# Patient Record
Sex: Female | Born: 1965 | Race: Black or African American | Hispanic: No | Marital: Single | Smoking: Never smoker
Health system: Southern US, Community
[De-identification: ages and names within clinical notes are randomized; demographics above are authoritative.]

## PROBLEM LIST (undated history)

## (undated) DIAGNOSIS — M810 Age-related osteoporosis without current pathological fracture: Secondary | ICD-10-CM

## (undated) DIAGNOSIS — D219 Benign neoplasm of connective and other soft tissue, unspecified: Secondary | ICD-10-CM

## (undated) DIAGNOSIS — E785 Hyperlipidemia, unspecified: Secondary | ICD-10-CM

## (undated) DIAGNOSIS — N83209 Unspecified ovarian cyst, unspecified side: Secondary | ICD-10-CM

## (undated) DIAGNOSIS — R87619 Unspecified abnormal cytological findings in specimens from cervix uteri: Secondary | ICD-10-CM

## (undated) DIAGNOSIS — A64 Unspecified sexually transmitted disease: Secondary | ICD-10-CM

## (undated) DIAGNOSIS — G709 Myoneural disorder, unspecified: Secondary | ICD-10-CM

## (undated) HISTORY — DX: Hyperlipidemia, unspecified: E78.5

## (undated) HISTORY — PX: BREAST BIOPSY: SHX20

## (undated) HISTORY — PX: IUD REMOVAL: SHX5392

## (undated) HISTORY — DX: Unspecified ovarian cyst, unspecified side: N83.209

## (undated) HISTORY — DX: Age-related osteoporosis without current pathological fracture: M81.0

## (undated) HISTORY — PX: COLPOSCOPY: SHX161

## (undated) HISTORY — DX: Benign neoplasm of connective and other soft tissue, unspecified: D21.9

## (undated) HISTORY — PX: TONSILLECTOMY: SUR1361

## (undated) HISTORY — DX: Unspecified sexually transmitted disease: A64

## (undated) HISTORY — DX: Myoneural disorder, unspecified: G70.9

## (undated) HISTORY — PX: EXCISION VAGINAL CYST: SHX5825

## (undated) HISTORY — DX: Unspecified abnormal cytological findings in specimens from cervix uteri: R87.619

---

## 2017-03-19 ENCOUNTER — Encounter (HOSPITAL_BASED_OUTPATIENT_CLINIC_OR_DEPARTMENT_OTHER): Payer: Self-pay | Admitting: Emergency Medicine

## 2017-03-19 ENCOUNTER — Emergency Department (HOSPITAL_BASED_OUTPATIENT_CLINIC_OR_DEPARTMENT_OTHER): Payer: Self-pay

## 2017-03-19 ENCOUNTER — Emergency Department (HOSPITAL_BASED_OUTPATIENT_CLINIC_OR_DEPARTMENT_OTHER)
Admission: EM | Admit: 2017-03-19 | Discharge: 2017-03-19 | Disposition: A | Discharge status: Home / Self Care | Payer: Self-pay | Attending: Emergency Medicine | Admitting: Emergency Medicine

## 2017-03-19 DIAGNOSIS — S29012A Strain of muscle and tendon of back wall of thorax, initial encounter: Secondary | ICD-10-CM | POA: Insufficient documentation

## 2017-03-19 DIAGNOSIS — R0602 Shortness of breath: Secondary | ICD-10-CM | POA: Insufficient documentation

## 2017-03-19 DIAGNOSIS — Y929 Unspecified place or not applicable: Secondary | ICD-10-CM | POA: Insufficient documentation

## 2017-03-19 DIAGNOSIS — Y93E1 Activity, personal bathing and showering: Secondary | ICD-10-CM | POA: Insufficient documentation

## 2017-03-19 DIAGNOSIS — Y999 Unspecified external cause status: Secondary | ICD-10-CM | POA: Insufficient documentation

## 2017-03-19 DIAGNOSIS — X509XXA Other and unspecified overexertion or strenuous movements or postures, initial encounter: Secondary | ICD-10-CM | POA: Insufficient documentation

## 2017-03-19 DIAGNOSIS — R0789 Other chest pain: Secondary | ICD-10-CM

## 2017-03-19 DIAGNOSIS — S46812A Strain of other muscles, fascia and tendons at shoulder and upper arm level, left arm, initial encounter: Secondary | ICD-10-CM

## 2017-03-19 LAB — CBC WITH DIFFERENTIAL/PLATELET
BASOS ABS: 0 10*3/uL (ref 0.0–0.1)
BASOS PCT: 0 %
EOS PCT: 1 %
Eosinophils Absolute: 0 10*3/uL (ref 0.0–0.7)
HCT: 39.9 % (ref 36.0–46.0)
Hemoglobin: 13.6 g/dL (ref 12.0–15.0)
Lymphocytes Relative: 46 %
Lymphs Abs: 1.7 10*3/uL (ref 0.7–4.0)
MCH: 28.4 pg (ref 26.0–34.0)
MCHC: 34.1 g/dL (ref 30.0–36.0)
MCV: 83.3 fL (ref 78.0–100.0)
MONO ABS: 0.3 10*3/uL (ref 0.1–1.0)
Monocytes Relative: 8 %
NEUTROS ABS: 1.7 10*3/uL (ref 1.7–7.7)
Neutrophils Relative %: 45 %
PLATELETS: 211 10*3/uL (ref 150–400)
RBC: 4.79 MIL/uL (ref 3.87–5.11)
RDW: 14.1 % (ref 11.5–15.5)
WBC: 3.7 10*3/uL — AB (ref 4.0–10.5)

## 2017-03-19 LAB — TROPONIN I: Troponin I: <0.03 ng/mL (ref <0.03)

## 2017-03-19 LAB — BASIC METABOLIC PANEL
Anion gap: 7 (ref 5–15)
BUN: 16 mg/dL (ref 6–20)
CALCIUM: 9.7 mg/dL (ref 8.9–10.3)
CO2: 29 mmol/L (ref 22–32)
CREATININE: 0.76 mg/dL (ref 0.44–1.00)
Chloride: 103 mmol/L (ref 101–111)
Glucose, Bld: 102 mg/dL — ABNORMAL HIGH (ref 65–99)
Potassium: 4.3 mmol/L (ref 3.5–5.1)
SODIUM: 139 mmol/L (ref 135–145)

## 2017-03-19 LAB — D-DIMER, QUANTITATIVE (NOT AT ARMC)

## 2017-03-19 MED ORDER — SODIUM CHLORIDE 0.9 % IV BOLUS (SEPSIS)
1000.0000 mL | Freq: Once | INTRAVENOUS | Status: AC
Start: 2017-03-19 — End: 2017-03-19
  Administered 2017-03-19: 1000 mL via INTRAVENOUS

## 2017-03-19 MED ORDER — MORPHINE SULFATE (PF) 4 MG/ML IV SOLN: 6.0000 mg | Freq: Once | INTRAVENOUS | Status: DC

## 2017-03-19 MED ORDER — METHOCARBAMOL 1000 MG/10ML IJ SOLN
1000.0000 mg | Freq: Once | INTRAMUSCULAR | Status: AC
  Administered 2017-03-19: 1000 mg via INTRAVENOUS
  Filled 2017-03-19: qty 10

## 2017-03-19 MED ORDER — METHOCARBAMOL 500 MG PO TABS: 1000.0000 mg | ORAL_TABLET | Freq: Four times a day (QID) | ORAL | 0 refills | Status: DC | PRN

## 2017-03-19 MED ORDER — KETOROLAC TROMETHAMINE 30 MG/ML IJ SOLN
15.0000 mg | Freq: Once | INTRAMUSCULAR | Status: AC
  Administered 2017-03-19: 15 mg via INTRAVENOUS
  Filled 2017-03-19: qty 1

## 2017-03-19 MED ORDER — ASPIRIN 81 MG PO CHEW
324.0000 mg | CHEWABLE_TABLET | Freq: Once | ORAL | Status: AC
  Administered 2017-03-19: 324 mg via ORAL
  Filled 2017-03-19: qty 4

## 2017-03-19 MED ORDER — NITROGLYCERIN 0.4 MG SL SUBL
0.4000 mg | SUBLINGUAL_TABLET | SUBLINGUAL | Status: DC | PRN
  Administered 2017-03-19: 0.4 mg via SUBLINGUAL
  Filled 2017-03-19: qty 1

## 2017-03-19 MED ORDER — ASPIRIN EC 81 MG PO TBEC: 81.0000 mg | DELAYED_RELEASE_TABLET | Freq: Every day | ORAL | 0 refills | Status: DC

## 2017-03-19 MED ORDER — ACETAMINOPHEN 500 MG PO TABS
1000.0000 mg | ORAL_TABLET | Freq: Once | ORAL | Status: AC
  Administered 2017-03-19: 1000 mg via ORAL
  Filled 2017-03-19: qty 2

## 2017-03-19 NOTE — ED Notes (Signed)
ED Provider at bedside. 

## 2017-03-19 NOTE — ED Triage Notes (Signed)
Patient reports chest pain which began when she woke up this morning.  Reports this as a pressure at present which causes shortness of breath.  States that she now has left shoulder and left arm pain which began when she was in the shower.  States this feels as a numbness and tingling.

## 2017-03-19 NOTE — ED Notes (Signed)
Pt educated about not driving or performing other critical tasks (such as operating heavy machinery, caring for infant/toddler/child) due to sedative nature of medications received in ED. Also warned about risks of consuming alcohol or taking other medications with sedative properties. Pt/caregiver verbalized understanding.

## 2017-03-19 NOTE — ED Provider Notes (Addendum)
[facility] Provider Note   CSN: [id number] Arrival date & time: 03/19/17  1417     History   Chief Complaint Chief Complaint  Patient presents with  . Chest Pain    HPI Misty Massey is a 51 y.o. female.  HPI  51 year old female presents with a chief complaint of back pain and chest pain. She states that at around 7 am she felt some pain under her right breast. This was while she was in the shower. Then she noticed pain up over her left shoulder and left upper back. This has persisted since then. Since then she has also developed chest pressure that is worse with breathing. The pain in her back is worse with motion or sitting a certain way. She has not noticed exertional symptoms. When this first started in the shower she broke out into a sweat. She has some degree of shortness of breath but also pleuritic pain. No vomiting or abdominal pain. She feels like her arm has felt cold. No leg swelling or history of DVT/PE. No recent travel or surgery. She states she has had on and off leg pain for a couple months but thinks is due to work on the left side.  History reviewed. No pertinent past medical history.  There are no active problems to display for this patient.   History reviewed. No pertinent surgical history.  OB History    No data available       Home Medications    Prior to Admission medications   Medication Sig Start Date End Date Taking? Authorizing Provider  aspirin EC 81 MG tablet Take 1 tablet (81 mg total) by mouth daily. 03/19/17   Sherwood Gambler, MD  methocarbamol (ROBAXIN) 500 MG tablet Take 2 tablets (1,000 mg total) by mouth every 6 (six) hours as needed for muscle spasms. 03/19/17   Sherwood Gambler, MD    Family History History reviewed. No pertinent family history.  Social History Social History  Substance Use Topics  . Smoking status: Never Smoker  . Smokeless tobacco: Never Used  . Alcohol use No     Allergies   Patient has no  known allergies.   Review of Systems Review of Systems  Respiratory: Positive for shortness of breath.   Cardiovascular: Positive for chest pain. Negative for leg swelling.  Gastrointestinal: Negative for abdominal pain, nausea and vomiting.  Musculoskeletal: Positive for back pain.  Neurological: Positive for numbness.  All other systems reviewed and are negative.    Physical Exam Updated Vital Signs BP (!) 151/102 (BP Location: Right Arm)   Pulse 80   Temp 98.1 F (36.7 C) (Oral)   Resp 16   Ht 5\' 9"  (1.753 m)   Wt 68.9 kg (152 lb)   SpO2 100%   BMI 22.45 kg/m   Physical Exam  Constitutional: She is oriented to person, place, and time. She appears well-developed and well-nourished.  HENT:  Head: Normocephalic and atraumatic.  Right Ear: External ear normal.  Left Ear: External ear normal.  Nose: Nose normal.  Eyes: Right eye exhibits no discharge. Left eye exhibits no discharge.  Neck: Neck supple.  Cardiovascular: Normal rate, regular rhythm and normal heart sounds.   Pulses:      Radial pulses are 2+ on the right side, and 2+ on the left side.       Dorsalis pedis pulses are 2+ on the right side, and 2+ on the left side.  Pulmonary/Chest: Effort normal and breath sounds  normal. She exhibits no tenderness.  Abdominal: Soft. She exhibits no distension. There is no tenderness.  Musculoskeletal: She exhibits no edema.       Cervical back: She exhibits tenderness. She exhibits no swelling.       Back:  BUE are warm/well perfused and feel equal in warmth. Able to move both extremities but LUE is painful and hurts her back.  Neurological: She is alert and oriented to person, place, and time.  Skin: Skin is warm and dry. She is not diaphoretic.  Nursing note and vitals reviewed.    ED Treatments / Results  Labs (all labs ordered are listed, but only abnormal results are displayed) Labs Reviewed  CBC WITH DIFFERENTIAL/PLATELET - Abnormal; Notable for the following:        Result Value   WBC 3.7 (*)    All other components within normal limits  BASIC METABOLIC PANEL - Abnormal; Notable for the following:    Glucose, Bld 102 (*)    All other components within normal limits  TROPONIN I  D-DIMER, QUANTITATIVE (NOT AT [facility])  TROPONIN I    EKG  EKG Interpretation  Date/Time:  Friday March 19 2017 14:30:41 EDT Ventricular Rate:  89 PR Interval:    QRS Duration: 87 QT Interval:  520 QTC Calculation: 633 R Axis:   66 Text Interpretation:  Sinus rhythm Low voltage, extremity and precordial leads Nonspecific T abnormalities, inferior leads Prolonged QT interval Baseline wander in lead(s) II III aVF V1 V3 No old tracing to compare Confirmed by Sherwood Gambler [phone]) on 03/19/2017 2:57:56 PM       EKG Interpretation  Date/Time:  Friday March 19 2017 15:07:26 EDT Ventricular Rate:  82 PR Interval:    QRS Duration: 76 QT Interval:  357 QTC Calculation: 417 R Axis:   59 Text Interpretation:  Sinus rhythm Low voltage, precordial leads Borderline T abnormalities, inferior leads no significant change compared to earlier in the day Confirmed by Sherwood Gambler [phone]) on 03/19/2017 3:13:57 PM        Radiology Dg Chest 2 View  Result Date: 03/19/2017 CLINICAL DATA:  Chest pain. EXAM: CHEST  2 VIEW COMPARISON:  None. FINDINGS: The heart size and mediastinal contours are within normal limits. Both lungs are clear. Bilateral nipple shadows. The visualized skeletal structures are unremarkable. IMPRESSION: Normal chest x-ray. Electronically Signed   By: Titus Dubin M.D.   On: 03/19/2017 14:55    Procedures Procedures (including critical care time)  Medications Ordered in ED Medications  aspirin chewable tablet 324 mg (324 mg Oral Given 03/19/17 1517)  acetaminophen (TYLENOL) tablet 1,000 mg (1,000 mg Oral Given 03/19/17 1529)  sodium chloride 0.9 % bolus 1,000 mL (0 mLs Intravenous Stopped 03/19/17 1629)  ketorolac (TORADOL) 30 MG/ML  injection 15 mg (15 mg Intravenous Given 03/19/17 1544)  methocarbamol (ROBAXIN) injection 1,000 mg (1,000 mg Intravenous Given 03/19/17 1649)     Initial Impression / Assessment and Plan / ED Course  I have reviewed the triage vital signs and the nursing notes.  Pertinent labs & imaging results that were available during my care of the patient were reviewed by me and considered in my medical decision making (see chart for details).     Patient's HEART score is a 4. Thus I recommended admission for observation and further cardiac workup. She has thought about it and consider risks/benefits and decides to go home with outpatient follow-up. I will recommend she start on a baby aspirin. I think she has 2  separate issues. Her chest pressure resolved completely with one nitroglycerin. She has inferior T wave abnormalities but no ST depression or elevation. This is unchanged on multiple ECGs, even after pain resolved. Her back pain remained after nitro. This did get better with IV Toradol and IV Robaxin. I think this is muscular. I doubt dissection or PE. I did discuss that she may return at any time if she changes her mind and to especially return if her symptoms were to worsen or recur.  Final Clinical Impressions(s) / ED Diagnoses   Final diagnoses:  Chest pressure  Trapezius muscle strain, left, initial encounter    New Prescriptions Discharge Medication List as of 03/19/2017  6:59 PM    START taking these medications   Details  aspirin EC 81 MG tablet Take 1 tablet (81 mg total) by mouth daily., Starting Fri 03/19/2017, Print    methocarbamol (ROBAXIN) 500 MG tablet Take 2 tablets (1,000 mg total) by mouth every 6 (six) hours as needed for muscle spasms., Starting Fri 03/19/2017, Print         Sherwood Gambler, MD 03/19/17 1610    Sherwood Gambler, MD 03/19/17 2251

## 2017-03-24 ENCOUNTER — Encounter: Payer: Self-pay | Admitting: Cardiology

## 2017-03-24 ENCOUNTER — Ambulatory Visit (INDEPENDENT_AMBULATORY_CARE_PROVIDER_SITE_OTHER): Payer: Self-pay | Admitting: Cardiology

## 2017-03-24 DIAGNOSIS — R079 Chest pain, unspecified: Secondary | ICD-10-CM

## 2017-03-24 NOTE — Progress Notes (Signed)
Cardiology Office Note:    Date:  03/24/2017   ID:  Misty Massey, DOB 03-08-1966, MRN [id number]  PCP:  Patient, No Pcp Per  Cardiologist:  Jenean Lindau, MD   Referring MD: No ref. provider found    ASSESSMENT:    1. Chest pain, unspecified type    PLAN:    In order of problems listed above:  1. Primary prevention stressed to the patient. Importance of compliance with diet and medications stressed. Her chest pain symptoms are not typical for coronary artery disease. In view of this she will undergo exercise stress echo testing to reassure her. We will also check lipids. She will be seen in follow-up appointment in 6 weeks or earlier if she has any concerns. She knows to go to the nearest emergency room for any significant recurrences of symptoms.   Medication Adjustments/Labs and Tests Ordered: Current medicines are reviewed at length with the patient today.  Concerns regarding medicines are outlined above.  Orders Placed This Encounter  Procedures  . Lipid panel  . Hepatic function panel  . ECHOCARDIOGRAM STRESS TEST   No orders of the defined types were placed in this encounter.    History of Present Illness:    Misty Massey is a 51 y.o. female who is being seen today for the evaluation of chest pain at the request of the emergency room. Patient is a pleasant 51 year old female. She has no significant past medical history. She mentions to me that in the past she was told to have high cholesterol. She does not have a history of essential hypertension. She is an active lady but does not exercise on a regular basis. She mentions to me that she was having a shower and lifted her head and felt significant sharp pain in the shoulder. No orthopnea or PND. With activities of daily living she does not experience any symptoms. Her daughter accompanies her for this visit. At the time of my evaluation she is alert awake oriented and in no distress.   History reviewed. No  pertinent past medical history.  History reviewed. No pertinent surgical history.  Current Medications: Current Meds  Medication Sig  . aspirin EC 81 MG tablet Take 1 tablet (81 mg total) by mouth daily.  . methocarbamol (ROBAXIN) 500 MG tablet Take 2 tablets (1,000 mg total) by mouth every 6 (six) hours as needed for muscle spasms.     Allergies:   Patient has no known allergies.   Social History   Social History  . Marital status: Single    Spouse name: N/A  . Number of children: N/A  . Years of education: N/A   Social History Main Topics  . Smoking status: Never Smoker  . Smokeless tobacco: Never Used  . Alcohol use No  . Drug use: No  . Sexual activity: Not Asked   Other Topics Concern  . None   Social History Narrative  . None     Family History: The patient's Family history is unknown by patient.  ROS:   Please see the history of present illness.    All other systems reviewed and are negative.  EKGs/Labs/Other Studies Reviewed:    The following studies were reviewed today: I reviewed his reports from the emergency room and discussed these with the patient at length. EKG was reviewed also.   Recent Labs: 03/19/2017: BUN 16; Creatinine, Ser 0.76; Hemoglobin 13.6; Platelets 211; Potassium 4.3; Sodium 139  Recent Lipid Panel No results found for: CHOL, TRIG,  HDL, CHOLHDL, VLDL, LDLCALC, LDLDIRECT  Physical Exam:    VS:  BP 106/70   Pulse 88   Ht 5\' 9"  (1.753 m)   Wt 148 lb 6.4 oz (67.3 kg)   SpO2 100%   BMI 21.91 kg/m     Wt Readings from Last 3 Encounters:  03/24/17 148 lb 6.4 oz (67.3 kg)  03/19/17 152 lb (68.9 kg)     GEN: Patient is in no acute distress HEENT: Normal NECK: No JVD; No carotid bruits LYMPHATICS: No lymphadenopathy CARDIAC: S1 S2 regular, 2/6 systolic murmur at the apex. RESPIRATORY:  Clear to auscultation without rales, wheezing or rhonchi  ABDOMEN: Soft, non-tender, non-distended MUSCULOSKELETAL:  No edema; No deformity    SKIN: Warm and dry NEUROLOGIC:  Alert and oriented x 3 PSYCHIATRIC:  Normal affect    Signed, Jenean Lindau, MD  03/24/2017 4:46 PM    [facility]

## 2017-03-24 NOTE — Patient Instructions (Signed)
Medication Instructions:   Your physician recommends that you continue on your current medications as directed. Please refer to the Current Medication list given to you today.   Labwork:  NONE  Testing/Procedures:  Your physician has requested that you have a stress echocardiogram. For further information please visit [email or web address]. Please follow instruction sheet as given.    Follow-Up:  Your physician recommends that you schedule a follow-up appointment in: 6 weeks with Dr. Geraldo Pitter   Any Other Special Instructions Will Be Listed Below (If Applicable).     If you need a refill on your cardiac medications before your next appointment, please call your pharmacy.

## 2017-04-13 ENCOUNTER — Telehealth: Payer: Self-pay

## 2017-04-13 ENCOUNTER — Other Ambulatory Visit: Payer: Self-pay

## 2017-04-13 NOTE — Telephone Encounter (Signed)
Patient called to cancel stress echo. Per Baldo Ash, CMA she did not feel that it was needed at this time and that if she changed her mind she would call back. Misty Massey

## 2017-04-14 ENCOUNTER — Ambulatory Visit (HOSPITAL_BASED_OUTPATIENT_CLINIC_OR_DEPARTMENT_OTHER): Payer: Medicaid Other

## 2017-04-21 ENCOUNTER — Ambulatory Visit: Payer: Medicaid Other | Admitting: Cardiology

## 2017-05-04 ENCOUNTER — Ambulatory Visit: Payer: Medicaid Other | Admitting: Cardiology

## 2020-06-20 ENCOUNTER — Ambulatory Visit (INDEPENDENT_AMBULATORY_CARE_PROVIDER_SITE_OTHER): Payer: BC Managed Care – PPO

## 2020-06-20 ENCOUNTER — Encounter: Payer: Self-pay | Admitting: Family Medicine

## 2020-06-20 ENCOUNTER — Ambulatory Visit (INDEPENDENT_AMBULATORY_CARE_PROVIDER_SITE_OTHER): Payer: BC Managed Care – PPO | Admitting: Family Medicine

## 2020-06-20 ENCOUNTER — Other Ambulatory Visit: Payer: Self-pay

## 2020-06-20 VITALS — BP 134/90 | HR 84 | Temp 97.3°F | Ht 69.0 in | Wt 177.0 lb

## 2020-06-20 DIAGNOSIS — Z0001 Encounter for general adult medical examination with abnormal findings: Secondary | ICD-10-CM

## 2020-06-20 DIAGNOSIS — H9193 Unspecified hearing loss, bilateral: Secondary | ICD-10-CM

## 2020-06-20 DIAGNOSIS — Z0000 Encounter for general adult medical examination without abnormal findings: Secondary | ICD-10-CM

## 2020-06-20 DIAGNOSIS — Z114 Encounter for screening for human immunodeficiency virus [HIV]: Secondary | ICD-10-CM

## 2020-06-20 DIAGNOSIS — N83209 Unspecified ovarian cyst, unspecified side: Secondary | ICD-10-CM | POA: Diagnosis not present

## 2020-06-20 DIAGNOSIS — Z13228 Encounter for screening for other metabolic disorders: Secondary | ICD-10-CM

## 2020-06-20 DIAGNOSIS — G8929 Other chronic pain: Secondary | ICD-10-CM

## 2020-06-20 DIAGNOSIS — Z1159 Encounter for screening for other viral diseases: Secondary | ICD-10-CM

## 2020-06-20 DIAGNOSIS — Z1211 Encounter for screening for malignant neoplasm of colon: Secondary | ICD-10-CM

## 2020-06-20 DIAGNOSIS — Z1231 Encounter for screening mammogram for malignant neoplasm of breast: Secondary | ICD-10-CM | POA: Diagnosis not present

## 2020-06-20 DIAGNOSIS — Z130 Encounter for screening for diseases of the blood and blood-forming organs and certain disorders involving the immune mechanism: Secondary | ICD-10-CM

## 2020-06-20 DIAGNOSIS — M25572 Pain in left ankle and joints of left foot: Secondary | ICD-10-CM

## 2020-06-20 DIAGNOSIS — Z6826 Body mass index (BMI) 26.0-26.9, adult: Secondary | ICD-10-CM

## 2020-06-20 DIAGNOSIS — H539 Unspecified visual disturbance: Secondary | ICD-10-CM

## 2020-06-20 DIAGNOSIS — Z1321 Encounter for screening for nutritional disorder: Secondary | ICD-10-CM

## 2020-06-20 DIAGNOSIS — Z1329 Encounter for screening for other suspected endocrine disorder: Secondary | ICD-10-CM

## 2020-06-20 NOTE — Patient Instructions (Addendum)
Health Maintenance for Postmenopausal Women Menopause is a normal process in which your ability to get pregnant comes to an end. This process happens slowly over many months or years, usually between the ages of 60 and 31. Menopause is complete when you have missed your menstrual periods for 12 months. It is important to talk with your health care provider about some of the most common conditions that affect women after menopause (postmenopausal women). These include heart disease, cancer, and bone loss (osteoporosis). Adopting a healthy lifestyle and getting preventive care can help to promote your health and wellness. The actions you take can also lower your chances of developing some of these common conditions. What should I know about menopause? During menopause, you may get a number of symptoms, such as:  Hot flashes. These can be moderate or severe.  Night sweats.  Decrease in sex drive.  Mood swings.  Headaches.  Tiredness.  Irritability.  Memory problems.  Insomnia. Choosing to treat or not to treat these symptoms is a decision that you make with your health care provider. Do I need hormone replacement therapy?  Hormone replacement therapy is effective in treating symptoms that are caused by menopause, such as hot flashes and night sweats.  Hormone replacement carries certain risks, especially as you become older. If you are thinking about using estrogen or estrogen with progestin, discuss the benefits and risks with your health care provider. What is my risk for heart disease and stroke? The risk of heart disease, heart attack, and stroke increases as you age. One of the causes may be a change in the body's hormones during menopause. This can affect how your body uses dietary fats, triglycerides, and cholesterol. Heart attack and stroke are medical emergencies. There are many things that you can do to help prevent heart disease and stroke. Watch your blood  pressure  High blood pressure causes heart disease and increases the risk of stroke. This is more likely to develop in people who have high blood pressure readings, are of African descent, or are overweight.  Have your blood pressure checked: ? Every 3-5 years if you are 8-21 years of age. ? Every year if you are 78 years old or older. Eat a healthy diet   Eat a diet that includes plenty of vegetables, fruits, low-fat dairy products, and lean protein.  Do not eat a lot of foods that are high in solid fats, added sugars, or sodium. Get regular exercise Get regular exercise. This is one of the most important things you can do for your health. Most adults should:  Try to exercise for at least 150 minutes each week. The exercise should increase your heart rate and make you sweat (moderate-intensity exercise).  Try to do strengthening exercises at least twice each week. Do these in addition to the moderate-intensity exercise.  Spend less time sitting. Even light physical activity can be beneficial. Other tips  Work with your health care provider to achieve or maintain a healthy weight.  Do not use any products that contain nicotine or tobacco, such as cigarettes, e-cigarettes, and chewing tobacco. If you need help quitting, ask your health care provider.  Know your numbers. Ask your health care provider to check your cholesterol and your blood sugar (glucose). Continue to have your blood tested as directed by your health care provider. Do I need screening for cancer? Depending on your health history and family history, you may need to have cancer screening at different stages of your  life. This may include screening for:  Breast cancer.  Cervical cancer.  Lung cancer.  Colorectal cancer. What is my risk for osteoporosis? After menopause, you may be at increased risk for osteoporosis. Osteoporosis is a condition in which bone destruction happens more quickly than new bone creation.  To help prevent osteoporosis or the bone fractures that can happen because of osteoporosis, you may take the following actions:  If you are 69-62 years old, get at least 1,000 mg of calcium and at least 600 mg of vitamin D per day.  If you are older than age 61 but younger than age 50, get at least 1,200 mg of calcium and at least 600 mg of vitamin D per day.  If you are older than age 46, get at least 1,200 mg of calcium and at least 800 mg of vitamin D per day. Smoking and drinking excessive alcohol increase the risk of osteoporosis. Eat foods that are rich in calcium and vitamin D, and do weight-bearing exercises several times each week as directed by your health care provider. How does menopause affect my mental health? Depression may occur at any age, but it is more common as you become older. Common symptoms of depression include:  Low or sad mood.  Changes in sleep patterns.  Changes in appetite or eating patterns.  Feeling an overall lack of motivation or enjoyment of activities that you previously enjoyed.  Frequent crying spells. Talk with your health care provider if you think that you are experiencing depression. General instructions See your health care provider for regular wellness exams and vaccines. This may include:  Scheduling regular health, dental, and eye exams.  Getting and maintaining your vaccines. These include: ? Influenza vaccine. Get this vaccine each year before the flu season begins. ? Pneumonia vaccine. ? Shingles vaccine. ? Tetanus, diphtheria, and pertussis (Tdap) booster vaccine. Your health care provider may also recommend other immunizations. Tell your health care provider if you have ever been abused or do not feel safe at home. Summary  Menopause is a normal process in which your ability to get pregnant comes to an end.  This condition causes hot flashes, night sweats, decreased interest in sex, mood swings, headaches, or lack of  sleep.  Treatment for this condition may include hormone replacement therapy.  Take actions to keep yourself healthy, including exercising regularly, eating a healthy diet, watching your weight, and checking your blood pressure and blood sugar levels.  Get screened for cancer and depression. Make sure that you are up to date with all your vaccines. This information is not intended to replace advice given to you by your health care provider. Make sure you discuss any questions you have with your health care provider. Document Revised: 06/15/2018 Document Reviewed: 06/15/2018 Elsevier Patient Education  [employer].   If you have lab work done today you will be contacted with your lab results within the next 2 weeks.  If you have not heard from [place] then please contact us. The fastest way to get your results is to register for My Chart.   IF you received an x-ray today, you will receive an invoice from [facility]. Please contact [facility] at [phone] with questions or concerns regarding your invoice.   IF you received labwork today, you will receive an invoice from [facility]. Please contact [facility] at [phone] with questions or concerns regarding your invoice.   Our billing staff will not be able to assist you with questions regarding bills  from these companies.  You will be contacted with the lab results as soon as they are available. The fastest way to get your results is to activate your My Chart account. Instructions are located on the last page of this paperwork. If you have not heard from [place] regarding the results in 2 weeks, please contact this office.

## 2020-06-20 NOTE — Progress Notes (Addendum)
12/16/20219:24 AM  Misty Massey 02/05/1966, 54 y.o., female [id number]  Chief Complaint  Patient presents with  . Annual Exam    Lower Left side back pain - pt request referral to gyn due to hx of ovarian cyst     HPI:   Patient is a 54 y.o. female with past medical history significant for back pain who presents today for annual physical  Works at home. Lives by herself. Daughter visits frequently.  Screening Mammogram: Order placed Colon Ca:Order placed colonoscopy hemorrhoids PaP: will schedule here Flu: declines Covid: declines LMP 2017 Eye doctor: needs to go, poor vision Dentist: scheduled today  Left ankle pain   Depression screen PHQ 2/9 06/20/2020  Decreased Interest 0  Down, Depressed, Hopeless 0  PHQ - 2 Score 0    Fall Risk  06/20/2020  Falls in the past year? 0  Number falls in past yr: 0  Injury with Fall? 0  Follow up Falls evaluation completed     No Known Allergies  Prior to Admission medications   Medication Sig Start Date End Date Taking? Authorizing Provider  aspirin EC 81 MG tablet Take 1 tablet (81 mg total) by mouth daily. Patient not taking: Reported on 06/20/2020 03/19/17   Sherwood Gambler, MD  methocarbamol (ROBAXIN) 500 MG tablet Take 2 tablets (1,000 mg total) by mouth every 6 (six) hours as needed for muscle spasms. Patient not taking: Reported on 06/20/2020 03/19/17   Sherwood Gambler, MD    History reviewed. No pertinent past medical history.  History reviewed. No pertinent surgical history.  Social History   Tobacco Use  . Smoking status: Never Smoker  . Smokeless tobacco: Never Used  Substance Use Topics  . Alcohol use: No    Family History  Family history unknown: Yes    Review of Systems  Constitutional: Negative for chills, fever and malaise/fatigue.  HENT: Positive for hearing loss. Negative for tinnitus.   Eyes: Positive for blurred vision. Negative for double vision.       Poor vision at baseline   Respiratory: Negative for cough and shortness of breath.   Cardiovascular: Negative for chest pain, palpitations and leg swelling.  Gastrointestinal: Negative for abdominal pain, blood in stool, constipation, diarrhea, heartburn, nausea and vomiting.       Hemorrhoids  Genitourinary: Negative for dysuria, frequency and hematuria.  Musculoskeletal: Positive for back pain and joint pain (Left ankle pain).  Skin: Negative for rash.  Neurological: Positive for headaches (Takes aleve, weekly for this). Negative for dizziness and weakness.  Psychiatric/Behavioral: Negative for depression and suicidal ideas. The patient is not nervous/anxious.      OBJECTIVE:  Today's Vitals   06/20/20 0815  BP: 134/90  Pulse: 84  Temp: (!) 97.3 F (36.3 C)  SpO2: 98%  Weight: 177 lb (80.3 kg)  Height: 5' 9"  (1.753 m)   Body mass index is 26.14 kg/m.   Physical Exam Constitutional:      General: She is not in acute distress.    Appearance: Normal appearance. She is not ill-appearing.  HENT:     Head: Normocephalic.     Right Ear: Tympanic membrane, ear canal and external ear normal. There is no impacted cerumen.     Left Ear: Tympanic membrane, ear canal and external ear normal. There is no impacted cerumen.  Cardiovascular:     Rate and Rhythm: Normal rate and regular rhythm.     Pulses: Normal pulses.     Heart sounds: Normal heart sounds. No  murmur heard. No friction rub. No gallop.   Pulmonary:     Effort: Pulmonary effort is normal. No respiratory distress.     Breath sounds: Normal breath sounds. No stridor. No wheezing, rhonchi or rales.  Abdominal:     General: Bowel sounds are normal.     Palpations: Abdomen is soft.     Tenderness: There is no abdominal tenderness.  Musculoskeletal:     Right lower leg: No edema.     Left lower leg: No edema.  Skin:    General: Skin is warm and dry.  Neurological:     Mental Status: She is alert and oriented to person, place, and time.   Psychiatric:        Mood and Affect: Mood normal.        Behavior: Behavior normal.     No results found for this or any previous visit (from the past 24 hour(s)).  DG Ankle Complete Left  Result Date: 06/20/2020 CLINICAL DATA:  Pain EXAM: LEFT ANKLE COMPLETE - 3+ VIEW COMPARISON:  None. FINDINGS: Frontal, oblique, and lateral views were obtained. No fracture or joint effusion. No joint space narrowing or erosion. Ankle mortise appears intact. IMPRESSION: No fracture or appreciable arthropathy. Ankle mortise appears intact. Electronically Signed   By: Lowella Grip III M.D.   On: 06/20/2020 09:00     ASSESSMENT and PLAN  Problem List Items Addressed This Visit   None   Visit Diagnoses    Annual physical exam    -  Primary   Relevant Orders   CMP14+EGFR   Lipid Panel   Vitamin D, 25-hydroxy   Hemoglobin A1c   Magnesium   Phosphorus   Iron, TIBC and Ferritin Panel   CBC with Differential   TSH   Encounter for screening mammogram for malignant neoplasm of breast       Relevant Orders   MM DIGITAL SCREENING BILATERAL   Screening for HIV without presence of risk factors       Relevant Orders   HIV Antibody (routine testing w rflx)   Encounter for hepatitis C screening test for low risk patient       Relevant Orders   Hepatitis C antibody   BMI 26.0-26.9,adult       Special screening for malignant neoplasms, colon       Relevant Orders   Ambulatory referral to Gastroenterology   Chronic pain of left ankle       Relevant Orders   DG Ankle Complete Left (Completed)   Cyst of ovary, unspecified laterality       Relevant Orders   US Transvaginal Non-OB   Vision changes       Relevant Orders   Ambulatory referral to Ophthalmology   Bilateral hearing loss, unspecified hearing loss type       Relevant Orders   Ambulatory referral to Audiology     Will follow up with lab results  Encouraged her to look at vaccination records to determine last Tdap  Needs to  schedule pap.  Return in about 6 months (around 12/19/2020).    [name], FNP-BC [facility] at [place], [place] [address] Ph.  [phone] Fax [phone]  I have reviewed and agree with above documentation. Agustina Caroli, MD

## 2020-06-21 ENCOUNTER — Other Ambulatory Visit: Payer: Self-pay | Admitting: Family Medicine

## 2020-06-21 DIAGNOSIS — E559 Vitamin D deficiency, unspecified: Secondary | ICD-10-CM | POA: Insufficient documentation

## 2020-06-21 DIAGNOSIS — E782 Mixed hyperlipidemia: Secondary | ICD-10-CM | POA: Insufficient documentation

## 2020-06-21 LAB — LIPID PANEL
Chol/HDL Ratio: 4.7 ratio — ABNORMAL HIGH (ref 0.0–4.4)
Cholesterol, Total: 254 mg/dL — ABNORMAL HIGH (ref 100–199)
HDL: 54 mg/dL (ref >39)
LDL Chol Calc (NIH): 183 mg/dL — ABNORMAL HIGH (ref 0–99)
Triglycerides: 98 mg/dL (ref 0–149)
VLDL Cholesterol Cal: 17 mg/dL (ref 5–40)

## 2020-06-21 LAB — CMP14+EGFR
ALT: 39 IU/L — ABNORMAL HIGH (ref 0–32)
AST: 28 IU/L (ref 0–40)
Albumin/Globulin Ratio: 1.9 (ref 1.2–2.2)
Albumin: 4.6 g/dL (ref 3.8–4.9)
Alkaline Phosphatase: 90 IU/L (ref 44–121)
BUN/Creatinine Ratio: 19 (ref 9–23)
BUN: 16 mg/dL (ref 6–24)
Bilirubin Total: 0.5 mg/dL (ref 0.0–1.2)
CO2: 26 mmol/L (ref 20–29)
Calcium: 9.4 mg/dL (ref 8.7–10.2)
Chloride: 102 mmol/L (ref 96–106)
Creatinine, Ser: 0.85 mg/dL (ref 0.57–1.00)
GFR calc Af Amer: 90 mL/min/{1.73_m2} (ref >59)
GFR calc non Af Amer: 78 mL/min/{1.73_m2} (ref >59)
Globulin, Total: 2.4 g/dL (ref 1.5–4.5)
Glucose: 94 mg/dL (ref 65–99)
Potassium: 3.8 mmol/L (ref 3.5–5.2)
Sodium: 140 mmol/L (ref 134–144)
Total Protein: 7 g/dL (ref 6.0–8.5)

## 2020-06-21 LAB — CBC WITH DIFFERENTIAL/PLATELET
Basophils Absolute: 0 10*3/uL (ref 0.0–0.2)
Basos: 0 %
EOS (ABSOLUTE): 0.1 10*3/uL (ref 0.0–0.4)
Eos: 2 %
Hematocrit: 42.1 % (ref 34.0–46.6)
Hemoglobin: 13.8 g/dL (ref 11.1–15.9)
Immature Grans (Abs): 0 10*3/uL (ref 0.0–0.1)
Immature Granulocytes: 0 %
Lymphocytes Absolute: 1.6 10*3/uL (ref 0.7–3.1)
Lymphs: 46 %
MCH: 27.9 pg (ref 26.6–33.0)
MCHC: 32.8 g/dL (ref 31.5–35.7)
MCV: 85 fL (ref 79–97)
Monocytes Absolute: 0.3 10*3/uL (ref 0.1–0.9)
Monocytes: 8 %
Neutrophils Absolute: 1.6 10*3/uL (ref 1.4–7.0)
Neutrophils: 44 %
Platelets: 217 10*3/uL (ref 150–450)
RBC: 4.94 x10E6/uL (ref 3.77–5.28)
RDW: 14.1 % (ref 11.7–15.4)
WBC: 3.6 10*3/uL (ref 3.4–10.8)

## 2020-06-21 LAB — PHOSPHORUS: Phosphorus: 4.1 mg/dL (ref 3.0–4.3)

## 2020-06-21 LAB — VITAMIN D 25 HYDROXY (VIT D DEFICIENCY, FRACTURES): Vit D, 25-Hydroxy: 22.3 ng/mL — ABNORMAL LOW (ref 30.0–100.0)

## 2020-06-21 LAB — HEMOGLOBIN A1C
Est. average glucose Bld gHb Est-mCnc: 111 mg/dL
Hgb A1c MFr Bld: 5.5 % (ref 4.8–5.6)

## 2020-06-21 LAB — HEPATITIS C ANTIBODY: Hep C Virus Ab: <0.1 s/co ratio (ref 0.0–0.9)

## 2020-06-21 LAB — HIV ANTIBODY (ROUTINE TESTING W REFLEX): HIV Screen 4th Generation wRfx: NONREACTIVE

## 2020-06-21 LAB — IRON,TIBC AND FERRITIN PANEL
Ferritin: 28 ng/mL (ref 15–150)
Iron Saturation: 20 % (ref 15–55)
Iron: 66 ug/dL (ref 27–159)
Total Iron Binding Capacity: 324 ug/dL (ref 250–450)
UIBC: 258 ug/dL (ref 131–425)

## 2020-06-21 LAB — MAGNESIUM: Magnesium: 2.1 mg/dL (ref 1.6–2.3)

## 2020-06-21 LAB — TSH: TSH: 2.8 u[IU]/mL (ref 0.450–4.500)

## 2020-06-21 MED ORDER — VITAMIN D3 50 MCG (2000 UT) PO TABS: 1.0000 | ORAL_TABLET | Freq: Every day | ORAL | 3 refills | Status: DC

## 2020-06-21 MED ORDER — ROSUVASTATIN CALCIUM 10 MG PO TABS: 10.0000 mg | ORAL_TABLET | Freq: Every day | ORAL | 3 refills | Status: DC

## 2020-06-21 NOTE — Progress Notes (Unsigned)
c 

## 2020-06-21 NOTE — Progress Notes (Signed)
If you could let Misty Massey know overall her labs look good. Her vitamin D is low for which I would recommend a daily OTC Vitamin D3 supplement taken with food. Her Cholesterol is quite high. I would recommend starting a daily medication called crestor. I will send this to her pharmacy for her to start taking daily.

## 2020-06-24 ENCOUNTER — Telehealth: Payer: Self-pay | Admitting: Family Medicine

## 2020-06-24 NOTE — Telephone Encounter (Signed)
Conception Oms,  From [facility] imaging, calling stating that they are needing more info regarding this pts orders that were placed. They are requesting to have a call back.     [phone] ext [phone]

## 2020-06-25 NOTE — Telephone Encounter (Signed)
Called back and had to leave message 2 recent orders unsure what information is needed

## 2020-06-26 ENCOUNTER — Telehealth: Payer: Self-pay

## 2020-06-26 DIAGNOSIS — N83209 Unspecified ovarian cyst, unspecified side: Secondary | ICD-10-CM

## 2020-06-26 NOTE — Telephone Encounter (Signed)
No additional notes required  

## 2020-07-10 ENCOUNTER — Ambulatory Visit
Admission: RE | Admit: 2020-07-10 | Discharge: 2020-07-10 | Disposition: A | Discharge status: Home / Self Care | Payer: Medicaid Other | Source: Ambulatory Visit | Attending: Family Medicine | Admitting: Family Medicine

## 2020-07-10 DIAGNOSIS — N83209 Unspecified ovarian cyst, unspecified side: Secondary | ICD-10-CM

## 2020-07-16 ENCOUNTER — Ambulatory Visit: Payer: BC Managed Care – PPO | Attending: Family Medicine | Admitting: Audiologist

## 2020-07-16 ENCOUNTER — Other Ambulatory Visit: Payer: Self-pay

## 2020-07-16 DIAGNOSIS — H9193 Unspecified hearing loss, bilateral: Secondary | ICD-10-CM | POA: Diagnosis present

## 2020-07-16 NOTE — Procedures (Signed)
  Outpatient Audiology and [place], [place]  [id number] [phone]  AUDIOLOGICAL  EVALUATION  NAME: Misty Massey     DOB:   Mar 30, 1966      MRN: [id number]                                                                                     DATE: 07/16/2020     REFERENT: Just, Laurita Quint, FNP STATUS: Outpatient DIAGNOSIS: Normal Hearing    History: [name] was seen for an audiological evaluation. [name] is receiving a hearing evaluation due to concerns for hearing loss in the right ear. [name] has difficulty hearing while working from home. She wears a headset in her right ear. The people she is talking to sound muffled and unclear. No pain or pressure reported in either ear. No tinnitus present in both either ear. [name] has no history of ear pathology or infection. She fell and his her head over six months ago but no change in hearing was noted after the fall. Medical history negative for any risk factor for hearing loss. No other relevant case history reported.    Evaluation:   Otoscopy showed a clear view of the tympanic membranes, bilaterally  Tympanometry results were consistent with normal middle ear function, bilaterally    Audiometric testing was completed using conventional audiometry with insert transducer. Speech Recognition Thresholds were consistent with pure tone averages. Word Recognition was excellent at conversation level. Pure tone thresholds show normal hearing in both ears. Test results are consistent with normal hearing with good speech understanding.   Results:  The test results were reviewed with [facility]. She has normal hearing in both ears. She was able to repeat words down to a whisper level and repeat words at average conversational volume with 100% accuracy. She was told to invest in new headphones.    Recommendations: 1.   No further audiologic testing is needed unless future hearing concerns arise.    Alfonse Alpers   Audiologist, Au.D., CCC-A 07/16/2020  1:14 PM  Cc: Just, Laurita Quint, FNP

## 2020-08-21 ENCOUNTER — Other Ambulatory Visit: Payer: Self-pay

## 2020-08-21 ENCOUNTER — Ambulatory Visit
Admission: RE | Admit: 2020-08-21 | Discharge: 2020-08-21 | Disposition: A | Discharge status: Home / Self Care | Payer: BC Managed Care – PPO | Source: Ambulatory Visit | Attending: Family Medicine | Admitting: Family Medicine

## 2020-08-21 DIAGNOSIS — Z1231 Encounter for screening mammogram for malignant neoplasm of breast: Secondary | ICD-10-CM

## 2020-08-31 NOTE — Addendum Note (Signed)
Addended by: Davina Poke on: 08/31/2020 11:32 AM   Modules accepted: Orders

## 2020-09-05 ENCOUNTER — Ambulatory Visit (AMBULATORY_SURGERY_CENTER): Payer: BC Managed Care – PPO | Admitting: *Deleted

## 2020-09-05 ENCOUNTER — Other Ambulatory Visit: Payer: Self-pay

## 2020-09-05 VITALS — Ht 69.0 in | Wt 168.0 lb

## 2020-09-05 DIAGNOSIS — Z1211 Encounter for screening for malignant neoplasm of colon: Secondary | ICD-10-CM

## 2020-09-05 MED ORDER — NA SULFATE-K SULFATE-MG SULF 17.5-3.13-1.6 GM/177ML PO SOLN: ORAL | 0 refills | Status: DC

## 2020-09-05 NOTE — Progress Notes (Signed)
Patient's pre-visit was done today over the phone with the patient due to COVID-19 pandemic. Name,DOB and address verified. Insurance verified. Patient denies any allergies to Eggs and Soy. Patient denies any problems with anesthesia/sedation. Patient denies taking diet pills or blood thinners. Packet of Prep instructions mailed to patient including a copy of a consent form and pre-procedure patient acknowledgement form (with envelope to mail back to us)-pt is aware. Suprep  Prep coupon included. Patient understands to call us back with any questions or concerns. The patient is COVID-19 fully vaccinated, per patient. Patient is aware of our care-partner policy and [id number]-[id number] safety protocol. EMMI education assigned to the patient for the procedure, sent to [facility].

## 2020-09-19 ENCOUNTER — Encounter: Payer: Self-pay | Admitting: Internal Medicine

## 2020-09-19 ENCOUNTER — Other Ambulatory Visit: Payer: Self-pay

## 2020-09-19 ENCOUNTER — Telehealth: Payer: Self-pay | Admitting: Gastroenterology

## 2020-09-19 ENCOUNTER — Ambulatory Visit (AMBULATORY_SURGERY_CENTER): Payer: BC Managed Care – PPO | Admitting: Internal Medicine

## 2020-09-19 VITALS — BP 117/81 | HR 76 | Temp 97.7°F | Resp 13 | Ht 69.0 in | Wt 168.0 lb

## 2020-09-19 DIAGNOSIS — Z1211 Encounter for screening for malignant neoplasm of colon: Secondary | ICD-10-CM | POA: Diagnosis present

## 2020-09-19 MED ORDER — SODIUM CHLORIDE 0.9 % IV SOLN: 500.0000 mL | Freq: Once | INTRAVENOUS | Status: DC

## 2020-09-19 NOTE — Progress Notes (Signed)
No problems noted in the recovery room. maw 

## 2020-09-19 NOTE — Progress Notes (Signed)
Vitals-CW  Pt's states no medical or surgical changes since previsit or office visit.  Patient states that she had some blood streaked vomit.  She vomited 6 times.  Has not vomited since, and doesn't have vomited usually.

## 2020-09-19 NOTE — Telephone Encounter (Signed)
Oncall Note: Answering service sent a message that patient vomited immediately after drinking prep, callback number was inaccurate. I tried calling listed number in chart and patient didn't pick up. Please follow up this AM

## 2020-09-19 NOTE — Progress Notes (Signed)
PT taken to PACU. Monitors in place. VSS. Report given to RN. 

## 2020-09-19 NOTE — Patient Instructions (Addendum)
Normal Colonoscopy! You may resume your current medications today. Repeat colonoscopy in 10 years for screening purposes. Please call if any questions or concerns.    YOU HAD AN ENDOSCOPIC PROCEDURE TODAY AT [facility]:   Refer to the procedure report that was given to you for any specific questions about what was found during the examination.  If the procedure report does not answer your questions, please call your gastroenterologist to clarify.  If you requested that your care partner not be given the details of your procedure findings, then the procedure report has been included in a sealed envelope for you to review at your convenience later.  YOU SHOULD EXPECT: Some feelings of bloating in the abdomen. Passage of more gas than usual.  Walking can help get rid of the air that was put into your GI tract during the procedure and reduce the bloating. If you had a lower endoscopy (such as a colonoscopy or flexible sigmoidoscopy) you may notice spotting of blood in your stool or on the toilet paper. If you underwent a bowel prep for your procedure, you may not have a normal bowel movement for a few days.  Please Note:  You might notice some irritation and congestion in your nose or some drainage.  This is from the oxygen used during your procedure.  There is no need for concern and it should clear up in a day or so.  SYMPTOMS TO REPORT IMMEDIATELY:   Following lower endoscopy (colonoscopy or flexible sigmoidoscopy):  Excessive amounts of blood in the stool  Significant tenderness or worsening of abdominal pains  Swelling of the abdomen that is new, acute  Fever of 100F or higher   For urgent or emergent issues, a gastroenterologist can be reached at any hour by calling [phone]. Do not use MyChart messaging for urgent concerns.    DIET:  We do recommend a small meal at first, but then you may proceed to your regular diet.  Drink plenty of fluids but you should avoid  alcoholic beverages for 24 hours.  ACTIVITY:  You should plan to take it easy for the rest of today and you should NOT DRIVE or use heavy machinery until tomorrow (because of the sedation medicines used during the test).    FOLLOW UP: Our staff will call the number listed on your records 48-72 hours following your procedure to check on you and address any questions or concerns that you may have regarding the information given to you following your procedure. If we do not reach you, we will leave a message.  We will attempt to reach you two times.  During this call, we will ask if you have developed any symptoms of COVID 19. If you develop any symptoms (ie: fever, flu-like symptoms, shortness of breath, cough etc.) before then, please call [phone].  If you test positive for Covid 19 in the 2 weeks post procedure, please call and report this information to [place].    If any biopsies were taken you will be contacted by phone or by letter within the next 1-3 weeks.  Please call us at [phone] if you have not heard about the biopsies in 3 weeks.    SIGNATURES/CONFIDENTIALITY: You and/or your care partner have signed paperwork which will be entered into your electronic medical record.  These signatures attest to the fact that that the information above on your After Visit Summary has been reviewed and is understood.  Full responsibility of the confidentiality of  this discharge information lies with you and/or your care-partner.

## 2020-09-19 NOTE — Op Note (Signed)
[facility] Patient Name: Misty Massey Procedure Date: 09/19/2020 10:01 AM MRN: [id number] Endoscopist: Docia Chuck. Henrene Pastor , MD Age: 55 Referring MD:  Date of Birth: April 02, 1966 Gender: Female Account #: [id number] Procedure:                Colonoscopy Indications:              Screening for colorectal malignant neoplasm Medicines:                Monitored Anesthesia Care Procedure:                Pre-Anesthesia Assessment:                           - Prior to the procedure, a History and Physical                            was performed, and patient medications and                            allergies were reviewed. The patient's tolerance of                            previous anesthesia was also reviewed. The risks                            and benefits of the procedure and the sedation                            options and risks were discussed with the patient.                            All questions were answered, and informed consent                            was obtained. Prior Anticoagulants: The patient has                            taken no previous anticoagulant or antiplatelet                            agents. After reviewing the risks and benefits, the                            patient was deemed in satisfactory condition to                            undergo the procedure.                           After obtaining informed consent, the colonoscope                            was passed under direct vision. Throughout the  procedure, the patient's blood pressure, pulse, and                            oxygen saturations were monitored continuously. The                            Olympus [id number]) [id number] was introduced                            through the anus and advanced to the the cecum,                            identified by appendiceal orifice and ileocecal                            valve. The ileocecal valve,  appendiceal orifice,                            and rectum were photographed. The quality of the                            bowel preparation was excellent. The colonoscopy                            was performed without difficulty. The patient                            tolerated the procedure well. The bowel preparation                            used was SUPREP via split dose instruction. Scope In: 10:12:21 AM Scope Out: 10:25:10 AM Scope Withdrawal Time: 0 hours 9 minutes 45 seconds  Total Procedure Duration: 0 hours 12 minutes 49 seconds  Findings:                 The entire examined colon appeared normal on direct                            and retroflexion views. Complications:            No immediate complications. Estimated blood loss:                            None. Estimated Blood Loss:     Estimated blood loss: none. Impression:               - The entire examined colon is normal on direct and                            retroflexion views.                           - No specimens collected. Recommendation:           - Repeat colonoscopy in 10 years for screening  purposes.                           - Patient has a contact number available for                            emergencies. The signs and symptoms of potential                            delayed complications were discussed with the                            patient. Return to normal activities tomorrow.                            Written discharge instructions were provided to the                            patient.                           - Resume previous diet.                           - Continue present medications. Docia Chuck. Henrene Pastor, MD 09/19/2020 10:28:53 AM This report has been signed electronically.

## 2020-09-23 ENCOUNTER — Telehealth: Payer: Self-pay | Admitting: *Deleted

## 2020-09-23 NOTE — Telephone Encounter (Signed)
No answer for post procedure call back. Left message for patient to call with questions or concerns. 

## 2020-09-23 NOTE — Telephone Encounter (Signed)
Left message on f/u call 

## 2020-12-19 ENCOUNTER — Ambulatory Visit: Payer: BC Managed Care – PPO | Admitting: Family Medicine

## 2020-12-19 ENCOUNTER — Other Ambulatory Visit: Payer: Self-pay

## 2020-12-19 ENCOUNTER — Encounter: Payer: Self-pay | Admitting: Nurse Practitioner

## 2020-12-19 ENCOUNTER — Other Ambulatory Visit (HOSPITAL_COMMUNITY)
Admission: RE | Admit: 2020-12-19 | Discharge: 2020-12-19 | Disposition: A | Discharge status: Home / Self Care | Payer: BC Managed Care – PPO | Source: Ambulatory Visit | Attending: Nurse Practitioner | Admitting: Nurse Practitioner

## 2020-12-19 ENCOUNTER — Ambulatory Visit (INDEPENDENT_AMBULATORY_CARE_PROVIDER_SITE_OTHER): Payer: BC Managed Care – PPO | Admitting: Nurse Practitioner

## 2020-12-19 ENCOUNTER — Encounter: Payer: BC Managed Care – PPO | Admitting: Family Medicine

## 2020-12-19 VITALS — BP 120/78 | HR 80 | Resp 16 | Ht 69.75 in | Wt 166.0 lb

## 2020-12-19 DIAGNOSIS — R350 Frequency of micturition: Secondary | ICD-10-CM | POA: Diagnosis not present

## 2020-12-19 DIAGNOSIS — Z01419 Encounter for gynecological examination (general) (routine) without abnormal findings: Secondary | ICD-10-CM | POA: Diagnosis present

## 2020-12-19 DIAGNOSIS — A64 Unspecified sexually transmitted disease: Secondary | ICD-10-CM | POA: Insufficient documentation

## 2020-12-19 LAB — WET PREP FOR TRICH, YEAST, CLUE

## 2020-12-19 NOTE — Patient Instructions (Signed)
Happy Birthday tomorrow! Some things we discussed today: Make sure you establish care with another Primary care provider to help manage your High Cholesterol. I have included information about that in this summary. Make sure you exercise 150 min per week. Avoid processed foods. Incorporate healthy fat in your diet like salmon, avocado and olive oil.  Return to office if your urinary symptoms do not resolve. Your pap smear was collected today   Dyslipidemia Dyslipidemia is an imbalance of waxy, fat-like substances (lipids) in the blood. The body needs lipids in small amounts. Dyslipidemia often involves a high level of cholesterol or triglycerides, which are types oflipids. Common forms of dyslipidemia include: High levels of LDL cholesterol. LDL is the type of cholesterol that causes fatty deposits (plaques) to build up in the blood vessels that carry blood away from your heart (arteries). Low levels of HDL cholesterol. HDL cholesterol is the type of cholesterol that protects against heart disease. High levels of HDL remove the LDL buildup from arteries. High levels of triglycerides. Triglycerides are a fatty substance in the blood that is linked to a buildup of plaques in the arteries. What are the causes? Primary dyslipidemia is caused by changes (mutations) in genes that are passed down through families (inherited). These mutations cause several types of dyslipidemia. Secondary dyslipidemia is caused by lifestyle choices and diseases that lead to dyslipidemia, such as: Eating a diet that is high in animal fat. Not getting enough exercise. Having diabetes, kidney disease, liver disease, or thyroid disease. Drinking large amounts of alcohol. Using certain medicines. What increases the risk? You are more likely to develop this condition if you are an older man or if you are a woman who has gone through menopause. Other risk factors include: Having a family history of dyslipidemia. Taking  certain medicines, including birth control pills, steroids, some diuretics, and beta-blockers. Smoking cigarettes. Eating a high-fat diet. Having certain medical conditions such as diabetes, polycystic ovary syndrome (PCOS), kidney disease, liver disease, or hypothyroidism. Not exercising regularly. Being overweight or obese with too much belly fat. What are the signs or symptoms? In most cases, dyslipidemia does not usually cause any symptoms. In severe cases, very high lipid levels can cause: Fatty bumps under the skin (xanthomas). White or gray ring around the black center (pupil) of the eye. Very high triglyceride levels can cause inflammation of the pancreas (pancreatitis). How is this diagnosed? Your health care provider may diagnose dyslipidemia based on a routine blood test (fasting blood test). Because most people do not have symptoms of the condition, this blood testing (lipid profile) is done on adults age 23 and older and is repeated every 5 years. This test checks: Total cholesterol. This measures the total amount of cholesterol in your blood, including LDL cholesterol, HDL cholesterol, and triglycerides. A healthy number is below 200. LDL cholesterol. The target number for LDL cholesterol is different for each person, depending on individual risk factors. Ask your health care provider what your LDL cholesterol should be. HDL cholesterol. An HDL level of 60 or higher is best because it helps to protect against heart disease. A number below 77 for men or below 36 for women increases the risk for heart disease. Triglycerides. A healthy triglyceride number is below 150. If your lipid profile is abnormal, your health care provider may do other bloodtests. How is this treated? Treatment depends on the type of dyslipidemia that you have and your other risk factors for heart disease and stroke. Your health care provider  will have atarget range for your lipid levels based on this  information. For many people, this condition may be treated by lifestyle changes, such as diet and exercise. Your health care provider may recommend that you: Get regular exercise. Make changes to your diet. Quit smoking if you smoke. If diet changes and exercise do not help you reach your goals, your health care provider may also prescribe medicine to lower lipids. The most commonly prescribed type of medicine lowers your LDL cholesterol (statin drug). If you have a high triglyceride level, your provider may prescribe another type of drug (fibrate) or an omega-3 fish oil supplement, or both. Follow these instructions at home:  Eating and drinking Follow instructions from your health care provider or dietitian about eating or drinking restrictions. Eat a healthy diet as told by your health care provider. This can help you reach and maintain a healthy weight, lower your LDL cholesterol, and raise your HDL cholesterol. This may include: Limiting your calories, if you are overweight. Eating more fruits, vegetables, whole grains, fish, and lean meats. Limiting saturated fat, trans fat, and cholesterol. If you drink alcohol: Limit how much you use. Be aware of how much alcohol is in your drink. In the [place] one drink equals one 12 oz bottle of beer (355 mL), one 5 oz glass of wine (148 mL), or one 1 oz glass of hard liquor (44 mL). Do not drink alcohol if: Your health care provider tells you not to drink. You are pregnant, may be pregnant, or are planning to become pregnant. Activity Get regular exercise. Start an exercise and strength training program as told by your health care provider. Ask your health care provider what activities are safe for you. Your health care provider may recommend: 30 minutes of aerobic activity 4-6 days a week. Brisk walking is an example of aerobic activity. Strength training 2 days a week. General instructions Do not use any products that contain nicotine or  tobacco, such as cigarettes, e-cigarettes, and chewing tobacco. If you need help quitting, ask your health care provider. Take over-the-counter and prescription medicines only as told by your health care provider. This includes supplements. Keep all follow-up visits as told by your health care provider. Contact a health care provider if: You are: Having trouble sticking to your exercise or diet plan. Struggling to quit smoking or control your use of alcohol. Summary Dyslipidemia often involves a high level of cholesterol or triglycerides, which are types of lipids. Treatment depends on the type of dyslipidemia that you have and your other risk factors for heart disease and stroke. For many people, treatment starts with lifestyle changes, such as diet and exercise. Your health care provider may prescribe medicine to lower lipids. This information is not intended to replace advice given to you by your health care provider. Make sure you discuss any questions you have with your healthcare provider. Document Revised: 02/14/2018 Document Reviewed: 01/21/2018 Elsevier Patient Education  [place] Maintenance, Female Adopting a healthy lifestyle and getting preventive care are important in promoting health and wellness. Ask your health care provider about: The right schedule for you to have regular tests and exams. Things you can do on your own to prevent diseases and keep yourself healthy. What should I know about diet, weight, and exercise? Eat a healthy diet  Eat a diet that includes plenty of vegetables, fruits, low-fat dairy products, and lean protein. Do not eat a lot of foods that are high  in solid fats, added sugars, or sodium.  Maintain a healthy weight Body mass index (BMI) is used to identify weight problems. It estimates body fat based on height and weight. Your health care provider can help determineyour BMI and help you achieve or maintain a healthy  weight. Get regular exercise Get regular exercise. This is one of the most important things you can do for your health. Most adults should: Exercise for at least 150 minutes each week. The exercise should increase your heart rate and make you sweat (moderate-intensity exercise). Do strengthening exercises at least twice a week. This is in addition to the moderate-intensity exercise. Spend less time sitting. Even light physical activity can be beneficial. Watch cholesterol and blood lipids Have your blood tested for lipids and cholesterol at 54 years of age, then havethis test every 5 years. Have your cholesterol levels checked more often if: Your lipid or cholesterol levels are high. You are older than 54 years of age. You are at high risk for heart disease. What should I know about cancer screening? Depending on your health history and family history, you may need to have cancer screening at various ages. This may include screening for: Breast cancer. Cervical cancer. Colorectal cancer. Skin cancer. Lung cancer. What should I know about heart disease, diabetes, and high blood pressure? Blood pressure and heart disease High blood pressure causes heart disease and increases the risk of stroke. This is more likely to develop in people who have high blood pressure readings, are of African descent, or are overweight. Have your blood pressure checked: Every 3-5 years if you are 20-42 years of age. Every year if you are 26 years old or older. Diabetes Have regular diabetes screenings. This checks your fasting blood sugar level. Have the screening done: Once every three years after age 32 if you are at a normal weight and have a low risk for diabetes. More often and at a younger age if you are overweight or have a high risk for diabetes. What should I know about preventing infection? Hepatitis B If you have a higher risk for hepatitis B, you should be screened for this virus. Talk with your  health care provider to find out if you are at risk forhepatitis B infection. Hepatitis C Testing is recommended for: Everyone born from [birth year] through 1964. Anyone with known risk factors for hepatitis C. Sexually transmitted infections (STIs) Get screened for STIs, including gonorrhea and chlamydia, if: You are sexually active and are younger than 54 years of age. You are older than 54 years of age and your health care provider tells you that you are at risk for this type of infection. Your sexual activity has changed since you were last screened, and you are at increased risk for chlamydia or gonorrhea. Ask your health care provider if you are at risk. Ask your health care provider about whether you are at high risk for HIV. Your health care provider may recommend a prescription medicine to help prevent HIV infection. If you choose to take medicine to prevent HIV, you should first get tested for HIV. You should then be tested every 3 months for as long as you are taking the medicine. Pregnancy If you are about to stop having your period (premenopausal) and you may become pregnant, seek counseling before you get pregnant. Take 400 to 800 micrograms (mcg) of folic acid every day if you become pregnant. Ask for birth control (contraception) if you want to prevent pregnancy. Osteoporosis and menopause  Osteoporosis is a disease in which the bones lose minerals and strength with aging. This can result in bone fractures. If you are 31 years old or older, or if you are at risk for osteoporosis and fractures, ask your health care provider if you should: Be screened for bone loss. Take a calcium or vitamin D supplement to lower your risk of fractures. Be given hormone replacement therapy (HRT) to treat symptoms of menopause. Follow these instructions at home: Lifestyle Do not use any products that contain nicotine or tobacco, such as cigarettes, e-cigarettes, and chewing tobacco. If you need help  quitting, ask your health care provider. Do not use street drugs. Do not share needles. Ask your health care provider for help if you need support or information about quitting drugs. Alcohol use Do not drink alcohol if: Your health care provider tells you not to drink. You are pregnant, may be pregnant, or are planning to become pregnant. If you drink alcohol: Limit how much you use to 0-1 drink a day. Limit intake if you are breastfeeding. Be aware of how much alcohol is in your drink. In the [place] one drink equals one 12 oz bottle of beer (355 mL), one 5 oz glass of wine (148 mL), or one 1 oz glass of hard liquor (44 mL). General instructions Schedule regular health, dental, and eye exams. Stay current with your vaccines. Tell your health care provider if: You often feel depressed. You have ever been abused or do not feel safe at home. Summary Adopting a healthy lifestyle and getting preventive care are important in promoting health and wellness. Follow your health care provider's instructions about healthy diet, exercising, and getting tested or screened for diseases. Follow your health care provider's instructions on monitoring your cholesterol and blood pressure. This information is not intended to replace advice given to you by your health care provider. Make sure you discuss any questions you have with your healthcare provider. Document Revised: 06/15/2018 Document Reviewed: 06/15/2018 Elsevier Patient Education  2022 [employer].

## 2020-12-19 NOTE — Progress Notes (Signed)
55 y.o. [id number] Single Black or African American female here for annual exam.     Patient's last menstrual period was 06/20/2016.         Sometimes still has hot flashes, maybe one or two times per day, improving. Does not want medication.  Denies need for STD testing, declines vaginal symptoms  Noticed frequent urination x 3-4 days. No dysuria, does have urgency.  Had Pelvic US 07/2020 scheduled by PCP, 2 small fibroids, Endometrium 47mm, Ovaries not visualized  Went to PCP in 06/2020. She got a letter that the office closed.  Reviewed lab work with patient from December. Apparently office was  not able to get in touch with patient. Her NP recommended Crestor, she did not take, but is pretty sure she got the medication from her pharmacy.  Sexually active: No.  The current method of family planning is post menopausal status.    Exercising: No.   exercise Smoker:  no  Health Maintenance: Pap:  maybe 2015 History of abnormal Pap:  yes MMG:  08-21-20 category c density birads 1:neg Colonoscopy:  09-19-20 f/u 24yrs BMD:   none Gardasil:   n/a Covid-19: not done Hep C testing: neg 2021 Screening Labs: pt to establish with PCP   reports that she has never smoked. She has never used smokeless tobacco. She reports previous alcohol use. She reports that she does not use drugs.  Past Medical History:  Diagnosis Date   Abnormal Pap smear of cervix    Fibroid    Hyperlipidemia    Neuromuscular disorder ([place])    Osteoporosis    Ovarian cyst    STD (sexually transmitted disease)    genital hsv    Past Surgical History:  Procedure Laterality Date   BREAST BIOPSY Right    fibroids 2016   COLPOSCOPY     EXCISION VAGINAL CYST     IUD REMOVAL     TONSILLECTOMY      Current Outpatient Medications  Medication Sig Dispense Refill   cholecalciferol (VITAMIN D3) 25 MCG (1000 UNIT) tablet Take 1,000 Units by mouth daily.     No current facility-administered medications for this visit.     Family History  Problem Relation Age of Onset   Hepatitis C Mother    Hypertension Sister    Hypertension Sister    Other Daughter        sickle cell trait    Review of Systems  Constitutional: Negative.   HENT: Negative.    Eyes: Negative.   Respiratory: Negative.    Cardiovascular: Negative.   Gastrointestinal: Negative.   Endocrine: Negative.   Genitourinary: Negative.   Musculoskeletal: Negative.   Skin: Negative.   Allergic/Immunologic: Negative.   Neurological: Negative.   Hematological: Negative.   Psychiatric/Behavioral: Negative.     Exam:   BP 120/78   Pulse 80   Resp 16   Ht 5' 9.75" (1.772 m)   Wt 166 lb (75.3 kg)   LMP 06/20/2016 Comment: no bleeding since around 2017  BMI 23.99 kg/m   Height: 5' 9.75" (177.2 cm)  General appearance: alert, cooperative and appears stated age, no acute distress Head: Normocephalic, without obvious abnormality Neck: no adenopathy, thyroid normal to inspection and palpation Lungs: clear to auscultation bilaterally Breasts: normal appearance, no masses or tenderness Heart: regular rate and rhythm Abdomen: soft, non-tender; no masses,  no organomegaly Extremities: extremities normal, no edema Skin: No rashes or lesions Lymph nodes: Cervical, supraclavicular, and axillary nodes normal. No abnormal inguinal nodes  palpated Neurologic: Grossly normal   Pelvic: External genitalia:  no lesions              Urethra:  normal appearing urethra with no masses, tenderness or lesions              Bartholins and Skenes: normal                 Vagina: normal appearing vagina, appropriate for age, small amount thin yellow discharge, no lesions              Cervix: neg cervical motion tenderness, no visible lesions             Bimanual Exam:   Uterus:  normal size, contour, position, consistency, mobility, non-tender              Adnexa: normal adnexa              Wet mount: WNL  Joy, CMA Chaperone was present for exam.  A:   Well Woman with normal exam  Hypercholesterolemia Frequent Urination  P:   Pap :collected today  Mammogram:UTD, next due 08/2021  Labs:Urine Culture pending. Comprehensive blood work done 06/2020. Pt to establish care with new PCP. Discussed elevation in Cholesterol.  Medications: no new  Colorectal screening UTD

## 2020-12-20 LAB — CYTOLOGY - PAP
Comment: NEGATIVE
Diagnosis: NEGATIVE
High risk HPV: NEGATIVE

## 2020-12-21 LAB — URINALYSIS, COMPLETE W/RFL CULTURE
Bilirubin Urine: NEGATIVE
Glucose, UA: NEGATIVE
Hgb urine dipstick: NEGATIVE
Hyaline Cast: NONE SEEN /LPF
Ketones, ur: NEGATIVE
Nitrites, Initial: NEGATIVE
Protein, ur: NEGATIVE
RBC / HPF: NONE SEEN /HPF (ref 0–2)
Specific Gravity, Urine: 1.025 (ref 1.001–1.035)
pH: 5.5 (ref 5.0–8.0)

## 2020-12-21 LAB — URINE CULTURE
MICRO NUMBER:: 12015968
SPECIMEN QUALITY:: ADEQUATE

## 2020-12-21 LAB — CULTURE INDICATED

## 2020-12-23 ENCOUNTER — Other Ambulatory Visit: Payer: Self-pay | Admitting: Nurse Practitioner

## 2020-12-23 MED ORDER — AMOXICILLIN 500 MG PO CAPS: ORAL_CAPSULE | ORAL | 0 refills | Status: AC

## 2022-10-17 IMAGING — DX DG ANKLE COMPLETE 3+V*L*
3 series · 3 of 3 positions shown · non-contrast
Comparison: None.

CLINICAL DATA: Pain

EXAM:
LEFT ANKLE COMPLETE - 3+ VIEW

[ankle obl]
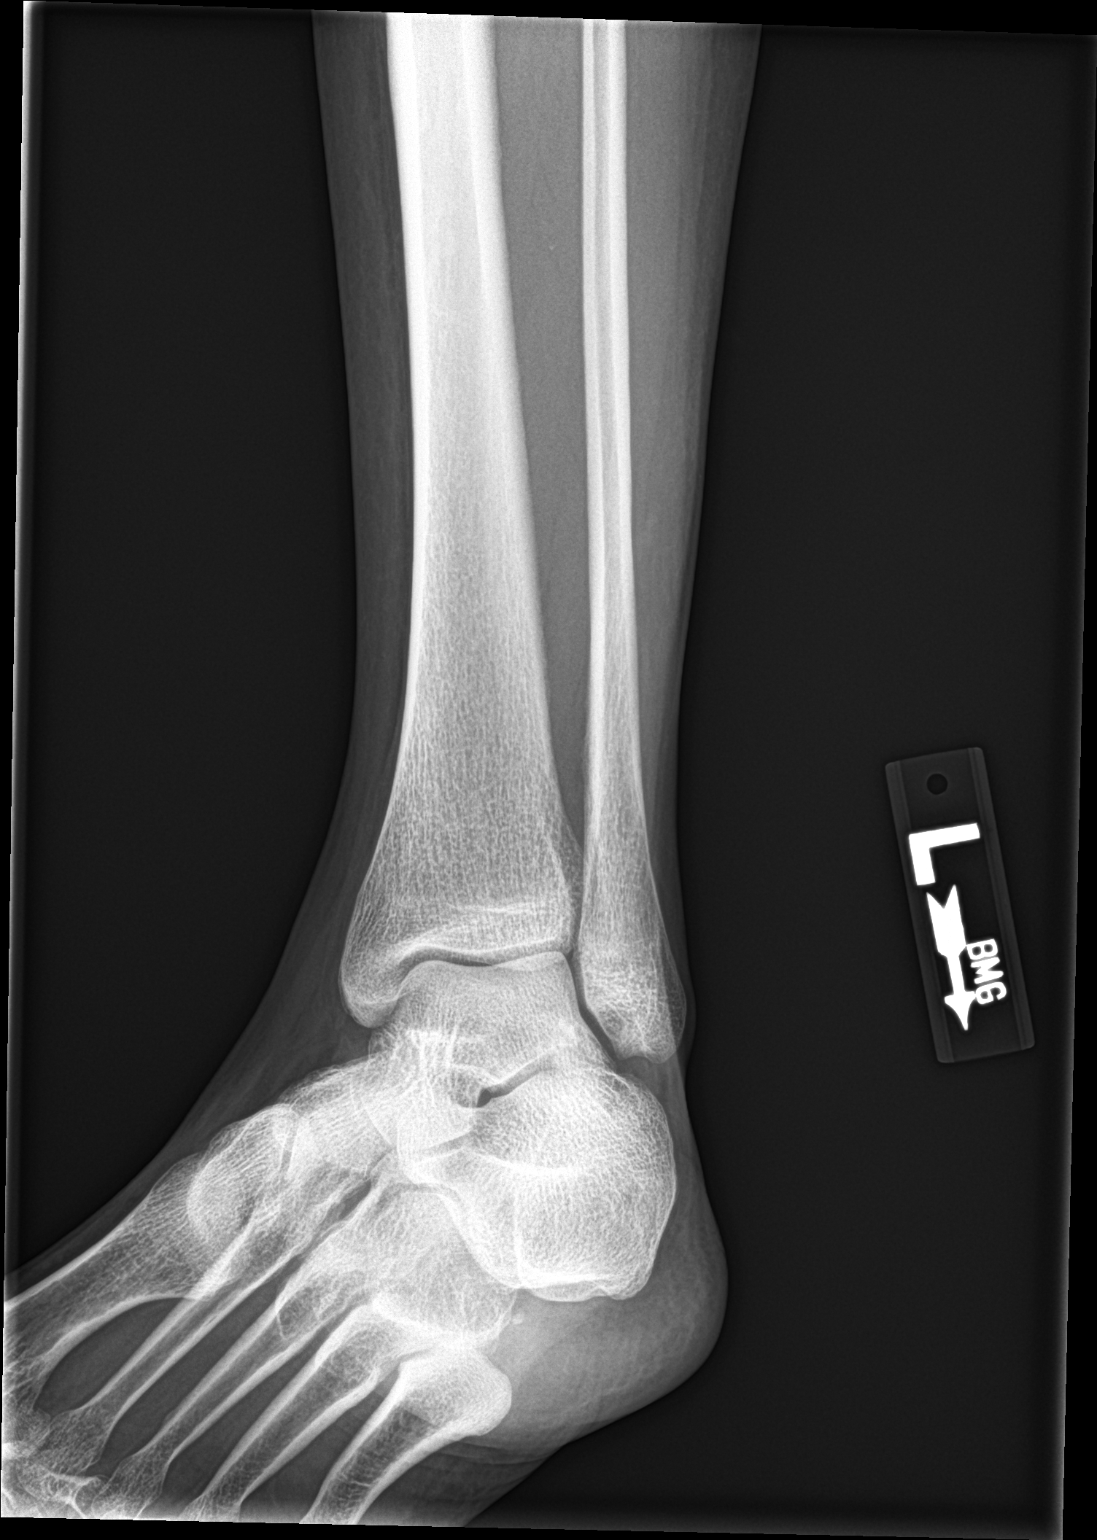

[ankle ap]
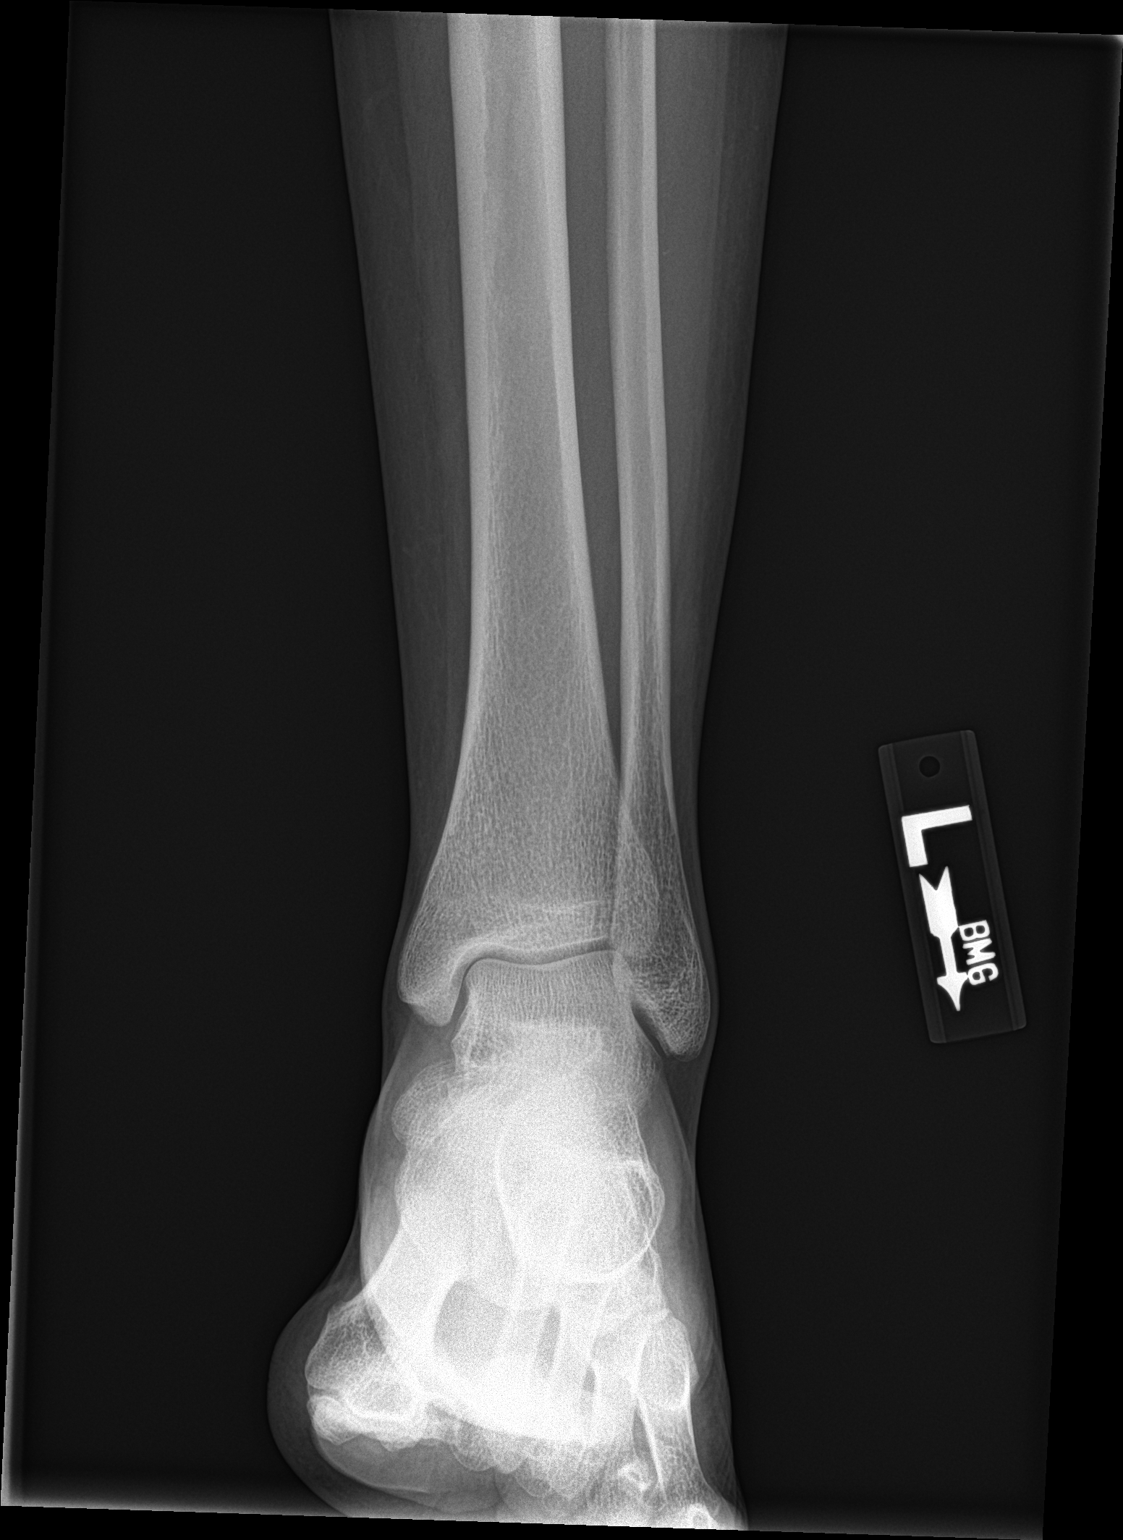

[ankle lat]
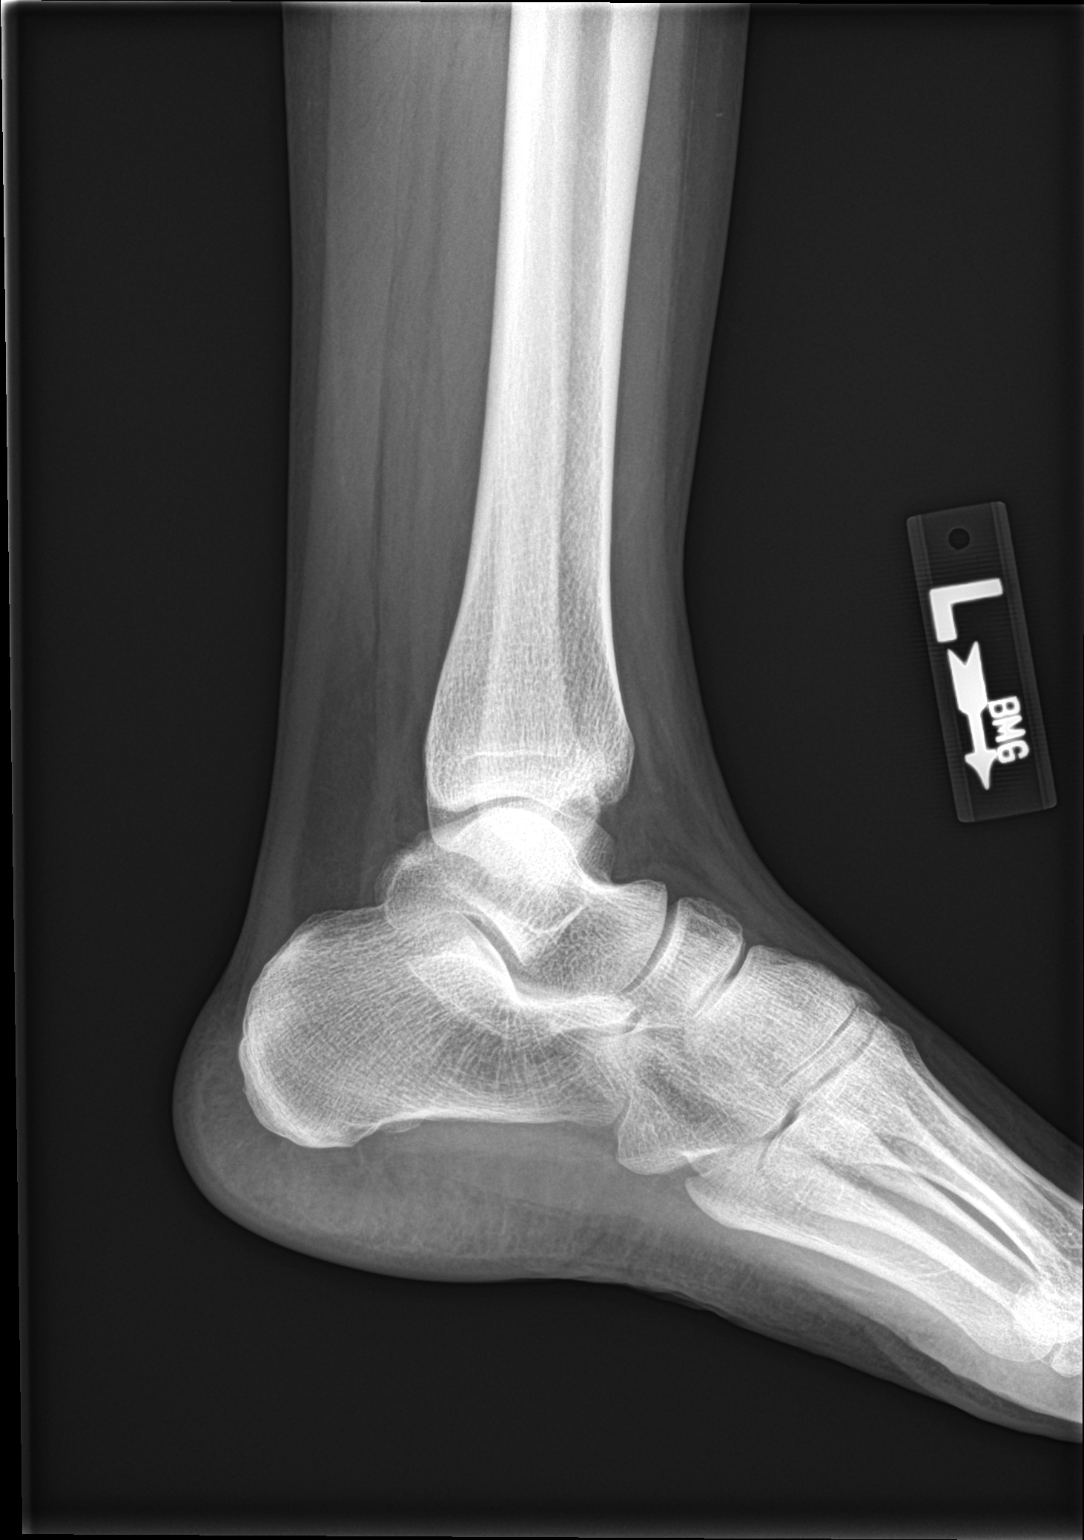

[3 of 3 positions shown; findings below may reference images not displayed]

FINDINGS: Frontal, oblique, and lateral views were obtained. No fracture or
joint effusion. No joint space narrowing or erosion. Ankle mortise
appears intact.
IMPRESSION: No fracture or appreciable arthropathy. Ankle mortise appears
intact.

## 2022-11-03 ENCOUNTER — Emergency Department (HOSPITAL_BASED_OUTPATIENT_CLINIC_OR_DEPARTMENT_OTHER): Admit: 2022-11-03 | Discharge: 2022-11-03 | Disposition: A | Discharge status: Home / Self Care | Payer: Self-pay

## 2022-11-03 ENCOUNTER — Encounter (HOSPITAL_COMMUNITY): Payer: Self-pay | Admitting: Emergency Medicine

## 2022-11-03 ENCOUNTER — Emergency Department (HOSPITAL_COMMUNITY)
Admission: EM | Admit: 2022-11-03 | Discharge: 2022-11-03 | Disposition: A | Discharge status: Home / Self Care | Payer: Self-pay | Attending: Emergency Medicine | Admitting: Emergency Medicine

## 2022-11-03 ENCOUNTER — Other Ambulatory Visit: Payer: Self-pay

## 2022-11-03 DIAGNOSIS — M79604 Pain in right leg: Secondary | ICD-10-CM | POA: Insufficient documentation

## 2022-11-03 DIAGNOSIS — M7989 Other specified soft tissue disorders: Secondary | ICD-10-CM

## 2022-11-03 NOTE — Discharge Instructions (Signed)
Seen today for pain and swelling in your right calf, your ultrasound did not show blood clot, please follow-up with primary care, come back to the ER for new or worsening symptoms.

## 2022-11-03 NOTE — ED Triage Notes (Signed)
Patient report c/o right leg swelling x1 day. Pt report worsening swelling and numbness today. Pt denies n/v. Pt a/ox4.

## 2022-11-03 NOTE — Progress Notes (Signed)
Right lower extremity venous duplex has been completed. Preliminary results can be found in CV Proc through chart review.  Results were given to [facility] PA.  11/03/22 8:09 PM Olen Cordial RVT

## 2022-11-03 NOTE — ED Provider Notes (Signed)
[place] EMERGENCY DEPARTMENT AT [facility] Provider Note   CSN: [id number] Arrival date & time: 11/03/22  1757     History  Chief Complaint  Patient presents with   Leg Swelling    Misty Massey is a 57 y.o. female.  Has chronic medical problems does not currently have a PCP.  Presents the ER complaining of right leg pain and swelling for the past couple of days, states she is worried about possible blood clot because her daughter was doing research online and said swelling and cramping in the calf could be a sign of a DVT.  Patient states her mother had a DVT.  Patient has no history of VTE, not on any hormonal medications, no chest pain or shortness of breath or other symptoms.  No redness or warmth to the extremity.  She does report occasional tingling to the right lateral thigh but states that has been intermittent for couple of days.  Denies any back pain, no extremity weakness, no saddle history or paresthesia.  States the swelling improved with compression stockings  HPI     Home Medications Prior to Admission medications   Medication Sig Start Date End Date Taking? Authorizing Provider  amoxicillin (AMOXIL) 500 MG capsule One capsule TID for 5 Days 12/23/20   Clarita Crane, NP  cholecalciferol (VITAMIN D3) 25 MCG (1000 UNIT) tablet Take 1,000 Units by mouth daily.    [provider]      Allergies    Patient has no known allergies.    Review of Systems   Review of Systems  Physical Exam Updated Vital Signs BP (!) 145/99   Pulse 84   Temp 98.4 F (36.9 C) (Oral)   Resp 18   Ht 5\' 9"  (1.753 m)   Wt 80 kg   LMP 06/20/2016 Comment: no bleeding since around 2017  SpO2 99%   BMI 26.05 kg/m  Physical Exam Vitals and nursing note reviewed.  Constitutional:      General: She is not in acute distress.    Appearance: She is well-developed.  HENT:     Head: Normocephalic and atraumatic.  Eyes:     Conjunctiva/sclera: Conjunctivae normal.   Cardiovascular:     Rate and Rhythm: Normal rate and regular rhythm.     Heart sounds: No murmur heard. Pulmonary:     Effort: Pulmonary effort is normal. No respiratory distress.     Breath sounds: Normal breath sounds.  Abdominal:     Palpations: Abdomen is soft.     Tenderness: There is no abdominal tenderness.  Musculoskeletal:        General: No swelling. Normal range of motion.     Cervical back: Neck supple.     Comments: Patient has no edema but has been wearing compression stockings.  DP and PT pulses 2+ bilaterally, sensation intact bilaterally, mild right calf tenderness on exam.  Skin:    General: Skin is warm and dry.     Capillary Refill: Capillary refill takes less than 2 seconds.  Neurological:     General: No focal deficit present.     Mental Status: She is alert and oriented to person, place, and time.  Psychiatric:        Mood and Affect: Mood normal.     ED Results / Procedures / Treatments   Labs (all labs ordered are listed, but only abnormal results are displayed) Labs Reviewed - No data to display  EKG None  Radiology VAS [place] LOWER  EXTREMITY VENOUS (DVT) (7a-7p)  Result Date: 11/03/2022  Lower Venous DVT Study Patient Name:  Misty Massey  Date of Exam:   11/03/2022 Medical Rec #: [id number]        Accession #:    [id number] Date of Birth: 1965-09-14        Patient Gender: F Patient Age:   73 years Exam Location:  [facility] Procedure:      VAS [place] LOWER EXTREMITY VENOUS (DVT) Referring Phys: Bralee Feldt --------------------------------------------------------------------------------  Indications: Swelling.  Risk Factors: None identified. Comparison Study: No prior studies. Performing Technologist: Chanda Busing RVT  Examination Guidelines: A complete evaluation includes B-mode imaging, spectral Doppler, color Doppler, and power Doppler as needed of all accessible portions of each vessel. Bilateral testing is considered an integral part of a  complete examination. Limited examinations for reoccurring indications may be performed as noted. The reflux portion of the exam is performed with the patient in reverse Trendelenburg.  +---------+---------------+---------+-----------+----------+--------------+ RIGHT    CompressibilityPhasicitySpontaneityPropertiesThrombus Aging +---------+---------------+---------+-----------+----------+--------------+ CFV      Full           Yes      Yes                                 +---------+---------------+---------+-----------+----------+--------------+ SFJ      Full                                                        +---------+---------------+---------+-----------+----------+--------------+ FV Prox  Full                                                        +---------+---------------+---------+-----------+----------+--------------+ FV Mid   Full                                                        +---------+---------------+---------+-----------+----------+--------------+ FV DistalFull                                                        +---------+---------------+---------+-----------+----------+--------------+ PFV      Full                                                        +---------+---------------+---------+-----------+----------+--------------+ POP      Full           Yes      Yes                                 +---------+---------------+---------+-----------+----------+--------------+ PTV      Full                                                        +---------+---------------+---------+-----------+----------+--------------+  PERO     Full                                                        +---------+---------------+---------+-----------+----------+--------------+   +----+---------------+---------+-----------+----------+--------------+ LEFTCompressibilityPhasicitySpontaneityPropertiesThrombus Aging  +----+---------------+---------+-----------+----------+--------------+ CFV Full           Yes      Yes                                 +----+---------------+---------+-----------+----------+--------------+     Summary: RIGHT: - There is no evidence of deep vein thrombosis in the lower extremity.  - No cystic structure found in the popliteal fossa.  LEFT: - No evidence of common femoral vein obstruction.  *See table(s) above for measurements and observations.    Preliminary     Procedures Procedures    Medications Ordered in ED Medications - No data to display  ED Course/ Medical Decision Making/ A&P                             Medical Decision Making DDX:  DVT, cellulitis, muscle spasm, radiculopathy, other Course: Patient presents to the ER, concern for DVT as she has been having some right calf cramping intermittently for couple of days.  She has normal sensation throughout her lower extremities, no appreciable swelling but she has been wearing compression socks which she states has resolved the swelling.  She has mild right calf tenderness on exam.  Normal perfusion of the extremity.  He reported some intermittent tingling to the right lateral thigh, possible meralgia paresthetica but has no acute symptoms, she is not obese.  She has no extremity weakness  DVT study which was negative.  Discussed with patient who felt reassured.  She does not have any radicular type symptoms, denies any injury or trauma, able to ambulate, good pulses sensation in the lower extremity.  Advised on PCP follow-up and return precautions.           Final Clinical Impression(s) / ED Diagnoses Final diagnoses:  Right leg pain    Rx / DC Orders ED Discharge Orders     None         Josem Kaufmann 11/03/22 2209    Linwood Dibbles, MD 11/03/22 2326

## 2022-12-18 IMAGING — MG DIGITAL SCREENING BILAT W/ CAD
4 series · 4 of 4 positions shown · non-contrast
Comparison: None.

CLINICAL DATA: Screening.

EXAM:
DIGITAL SCREENING BILATERAL MAMMOGRAM WITH CAD
TECHNIQUE: Bilateral screening digital craniocaudal and mediolateral oblique
mammograms were obtained. The images were evaluated with
computer-aided detection.

[R MLO]
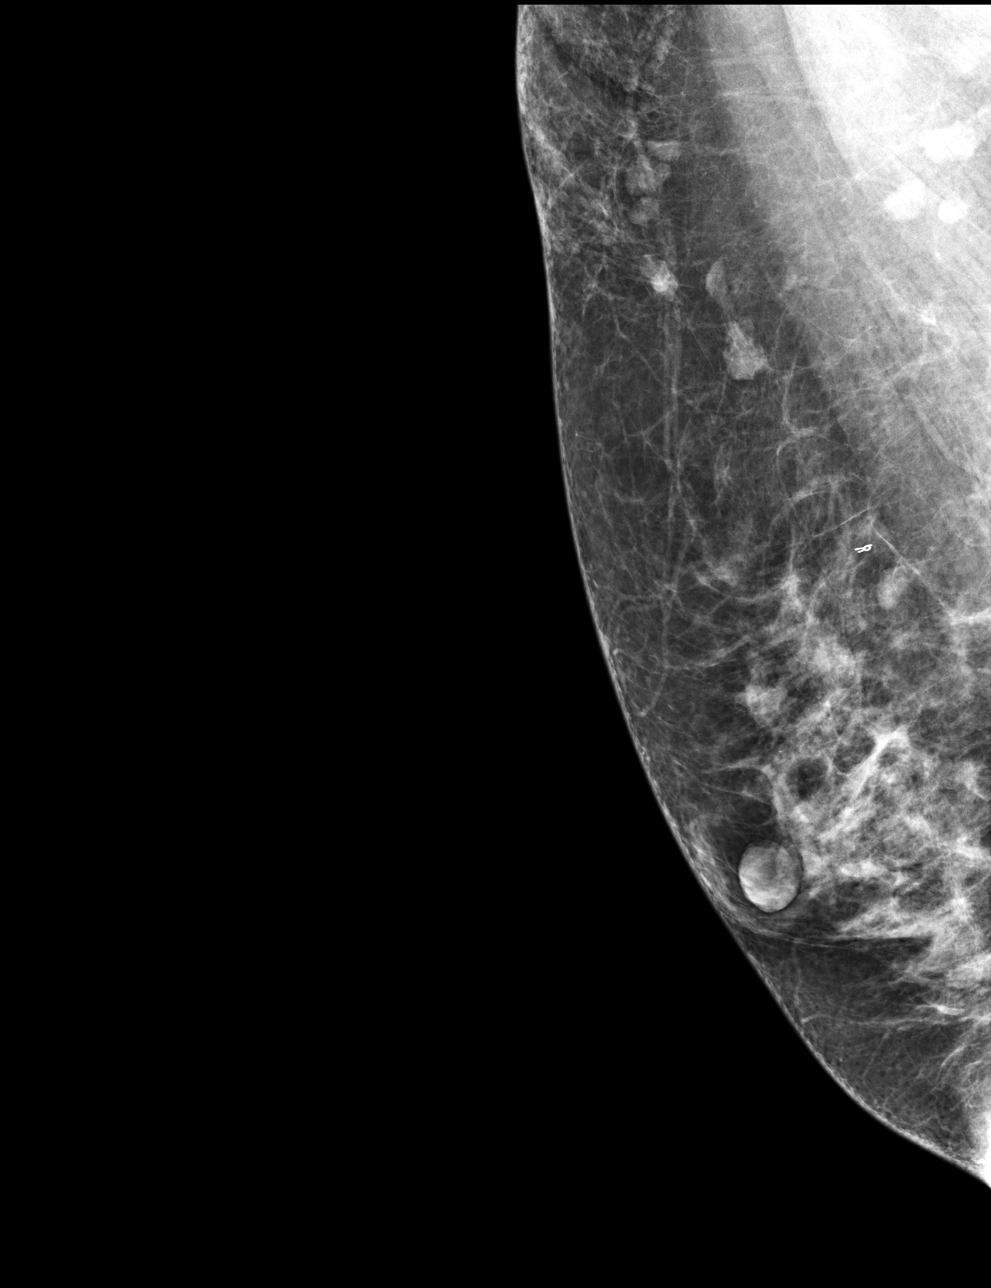

[L MLO]
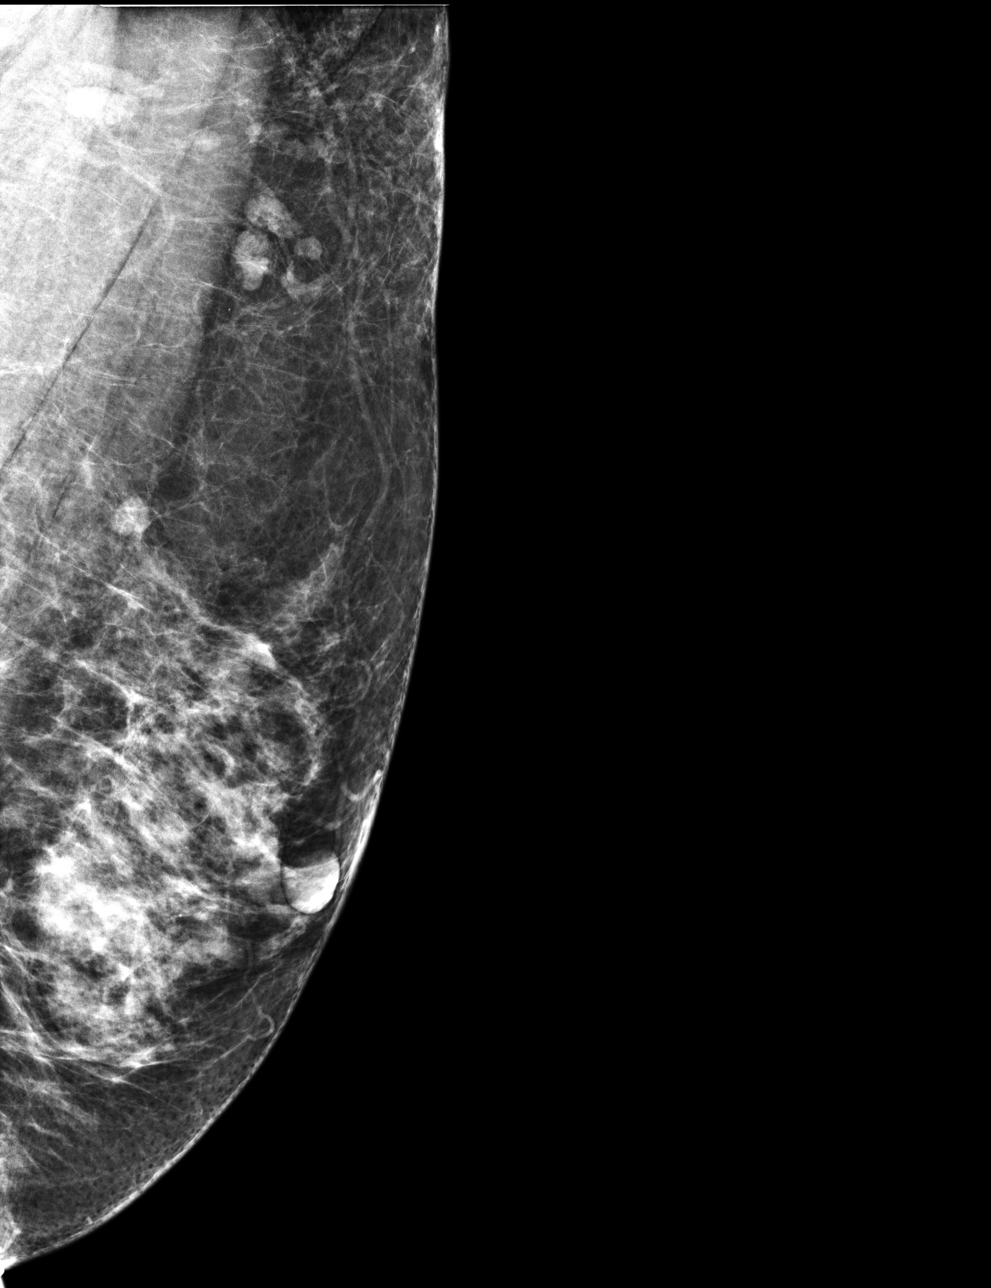

[L CC]
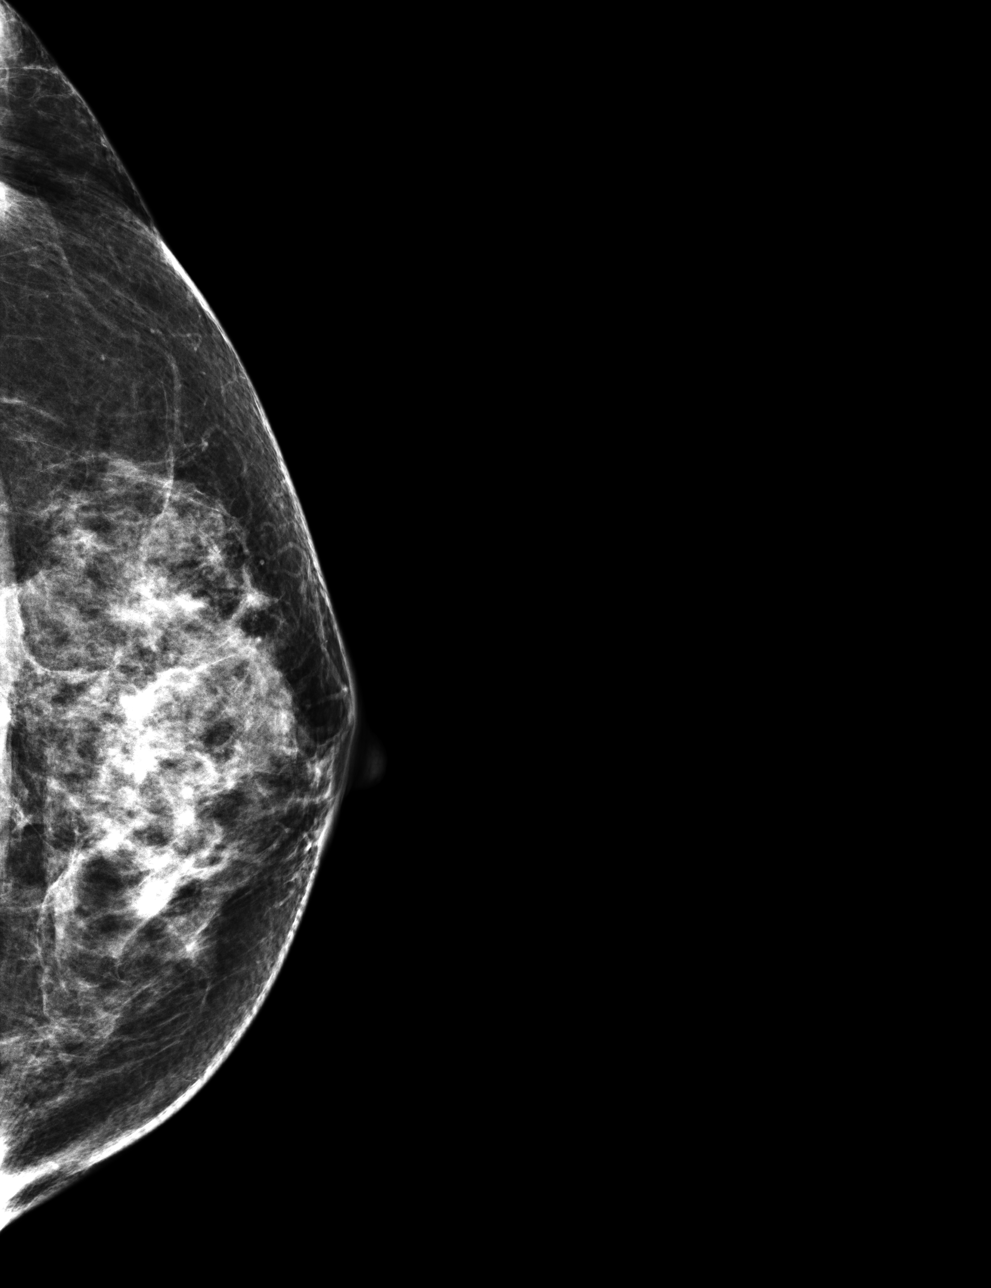

[R CC]
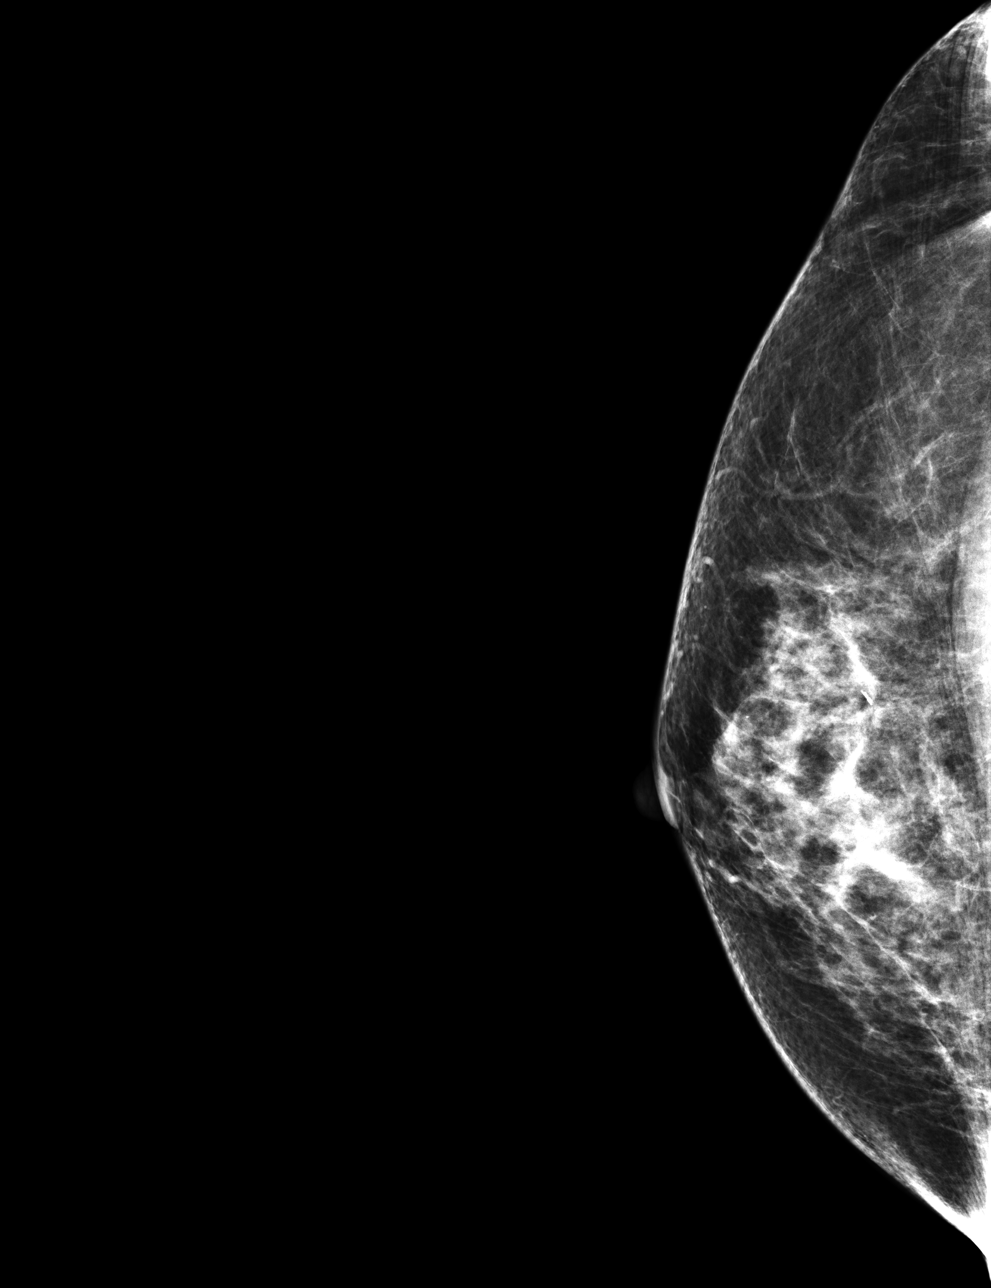

[4 of 4 positions shown; findings below may reference images not displayed]

ACR Breast Density Category c: The breast tissue is heterogeneously
dense, which may obscure small masses
FINDINGS: There are no findings suspicious for malignancy. The images were
evaluated with computer-aided detection.
IMPRESSION: No mammographic evidence of malignancy. A result letter of this
screening mammogram will be mailed directly to the patient.

RECOMMENDATION:
Screening mammogram in one year. (Code:69-V-IU6)

BI-RADS CATEGORY  1: Negative.
